# Patient Record
Sex: Female | Born: 1985 | Race: Black or African American | Hispanic: No | Marital: Single
Health system: Midwestern US, Community
[De-identification: ages and names within clinical notes are randomized; demographics above are authoritative.]

## PROBLEM LIST (undated history)

## (undated) MED ORDER — PHENAZOPYRIDINE 100 MG TAB
100 mg | ORAL_TABLET | Freq: Three times a day (TID) | ORAL | Status: AC
Start: ? — End: 2013-03-31

## (undated) MED ORDER — FLUCONAZOLE 150 MG TAB
150 mg | ORAL_TABLET | Freq: Every day | ORAL | Status: AC
Start: ? — End: 2013-07-25

## (undated) MED ORDER — OXYCODONE 5 MG TAB
5 mg | ORAL_TABLET | ORAL | Status: DC | PRN
Start: ? — End: 2013-11-13

## (undated) MED ORDER — CEPHALEXIN 500 MG CAP
500 mg | ORAL_CAPSULE | Freq: Two times a day (BID) | ORAL | Status: AC
Start: ? — End: 2013-03-23

## (undated) MED ORDER — VALACYCLOVIR 500 MG TAB: 500 mg | ORAL_TABLET | Freq: Two times a day (BID) | ORAL | Status: AC

## (undated) MED ORDER — IBUPROFEN 800 MG TAB
800 mg | ORAL_TABLET | Freq: Four times a day (QID) | ORAL | Status: DC | PRN
Start: ? — End: 2013-11-13

## (undated) MED ORDER — IRON 110 MG-FOLATE6 1 MG-C 175 MG-B12 12 MCG-BIOTIN-COPPER-DSS CAPSULE
110 mg-1 mg -175 mg-12 mcg | ORAL_TABLET | Freq: Every day | ORAL | Status: DC
Start: ? — End: 2013-11-13

## (undated) MED ORDER — PHENAZOPYRIDINE 100 MG TAB
100 mg | ORAL_TABLET | Freq: Three times a day (TID) | ORAL | Status: AC
Start: ? — End: 2013-03-19

## (undated) MED ORDER — ONDANSETRON 8 MG TAB, RAPID DISSOLVE
8 mg | ORAL_TABLET | Freq: Three times a day (TID) | ORAL | Status: AC | PRN
Start: ? — End: 2013-10-08

## (undated) MED ORDER — NITROFURANTOIN (25% MACROCRYSTAL FORM) 100 MG CAP
100 mg | ORAL_CAPSULE | Freq: Two times a day (BID) | ORAL | Status: DC
Start: ? — End: 2013-03-27

---
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
## 2008-01-27 IMAGING — CT CT ORBIT/TEMPORAL/IAC W/ CM
3 series · 16 of 47 positions shown, 19 images · IV contrast ([ID] OMNI 300)
Comparison: none

CLINICAL DATA: Swelling around left ear which has gotten worse and is painful.  
CT OF THE ORBITS WITH CONTRAST:
TECHNIQUE: Axial and coronal plane CT imaging was performed through the orbits after administration of intravenous contrast.
Contrast:  100 cc Omnipaque 300.

[Series 2: supine · axial · 0.38mm/px · z∈[+48,+130]mm · 10 of 39 slices shown, 13 images]
[im 3/39  brain]
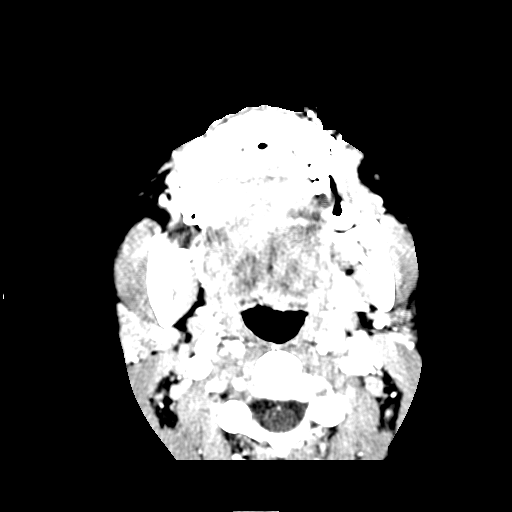
[im 3/39  bone]
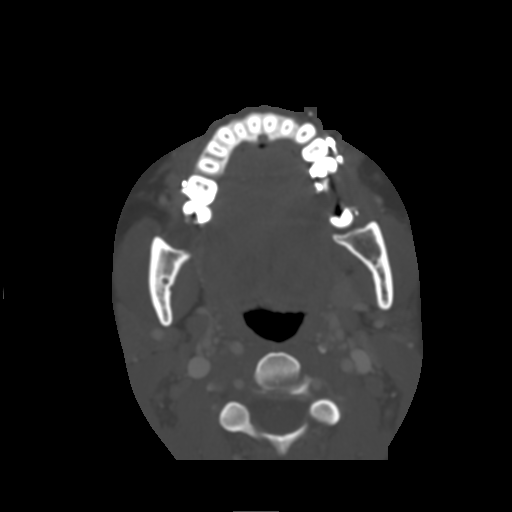
[im 7/39  bone]
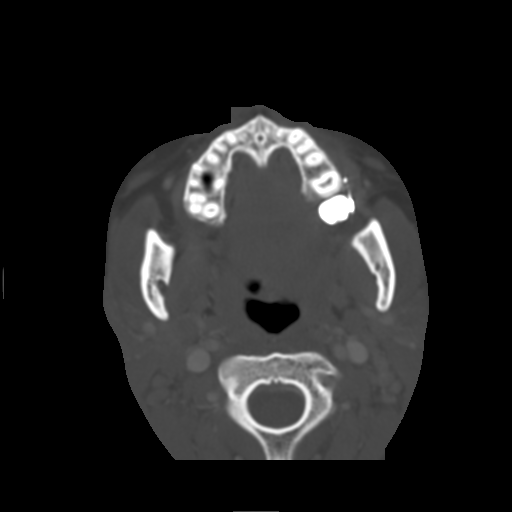
[im 11/39  bone]
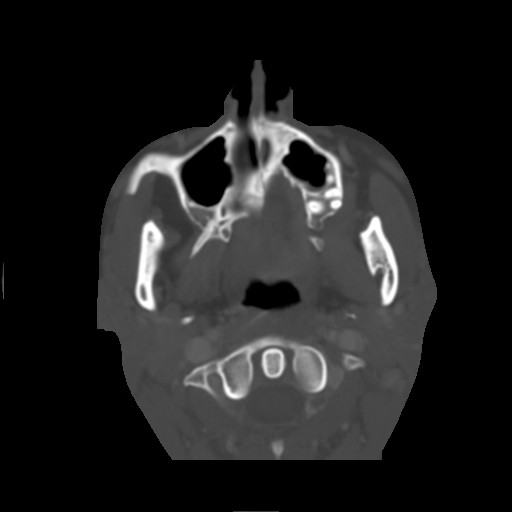
[im 14/39  bone]
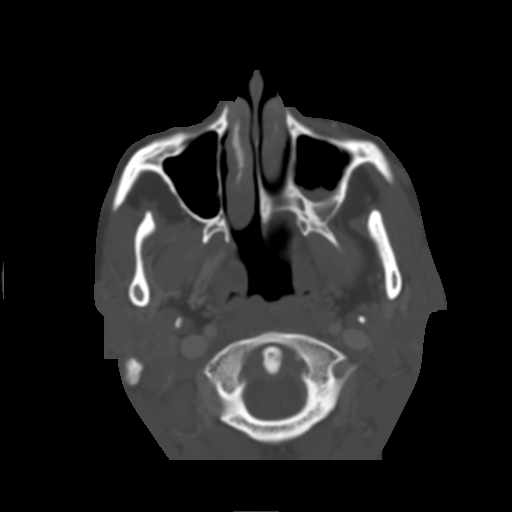
[im 18/39  brain]
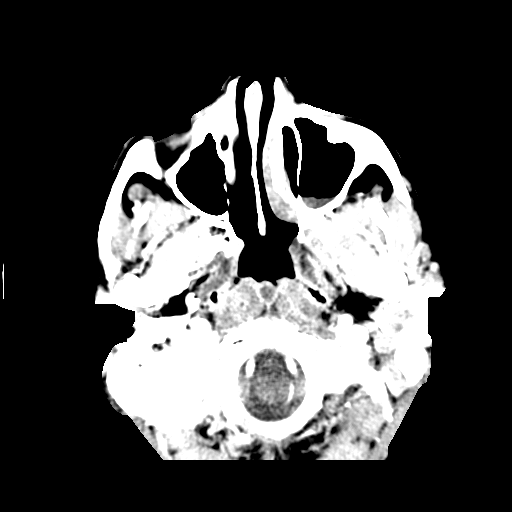
[im 18/39  bone]
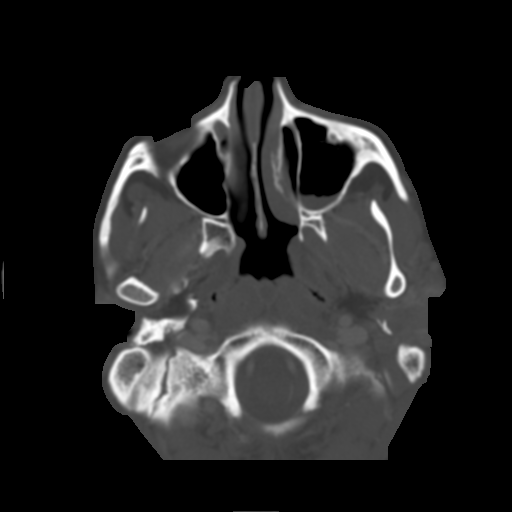
[im 21/39  bone]
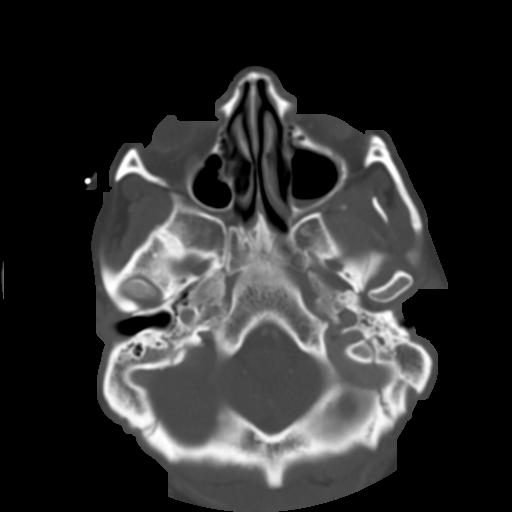
[im 25/39  bone]
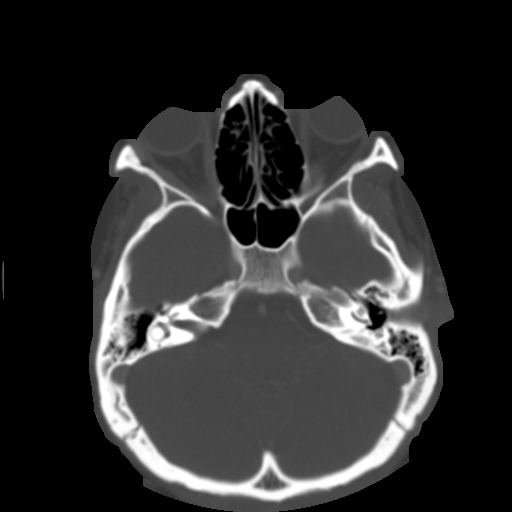
[im 29/39  bone]
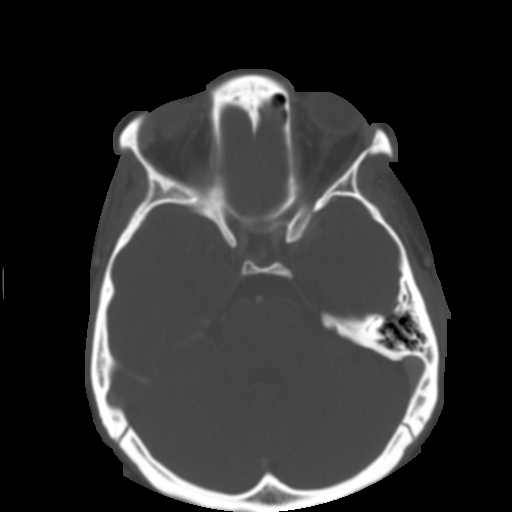
[im 32/39  brain]
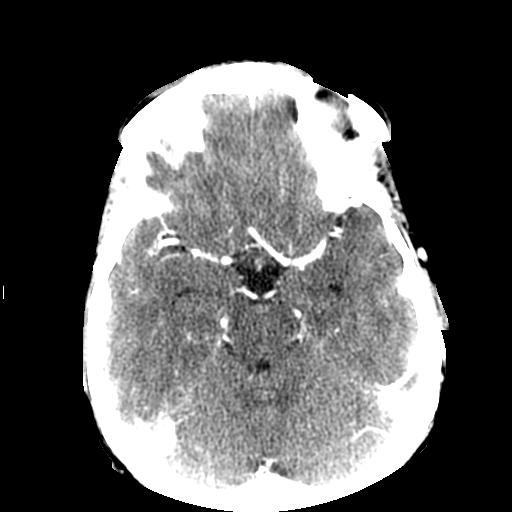
[im 32/39  bone]
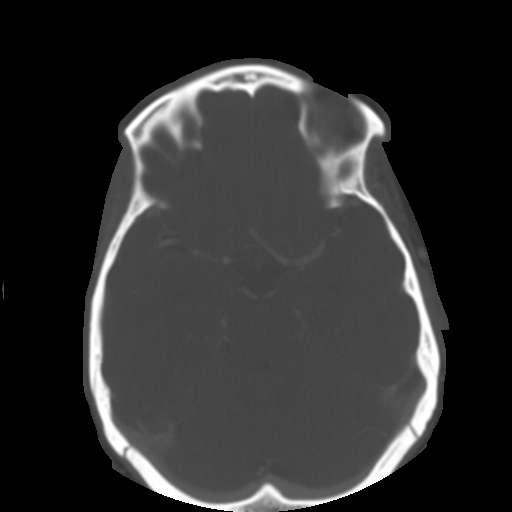
[im 36/39  bone]
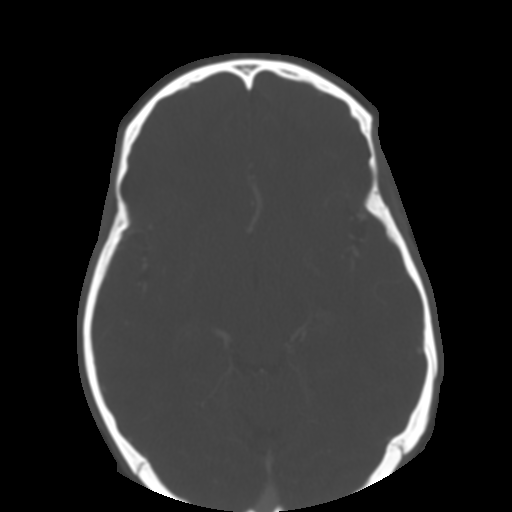

[Series 400: coronals · coronal · 0.37mm/px · 3 of 53 slices shown]
[im 18/53  bone]
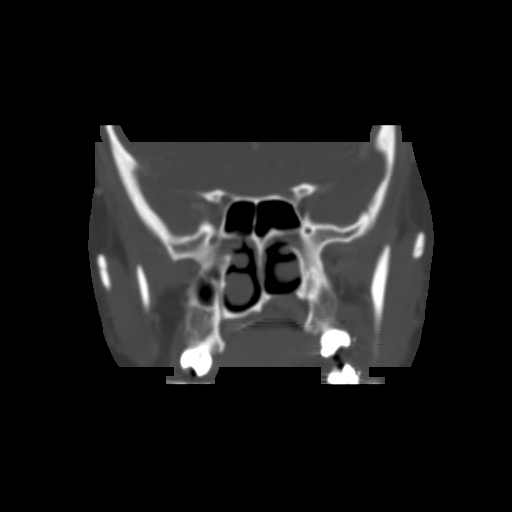
[im 24/53  bone]
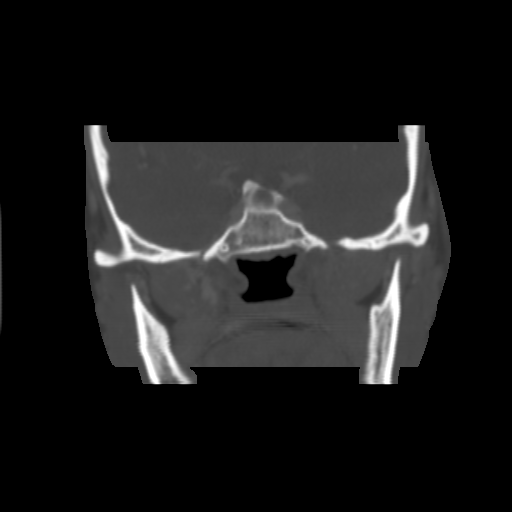
[im 29/53  bone]
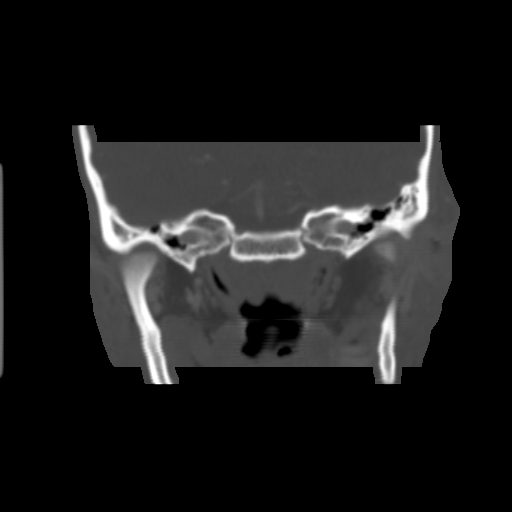

[Series 401: sagittals · sagittal · 0.37mm/px · 3 of 53 slices shown]
[im 18/53  bone]
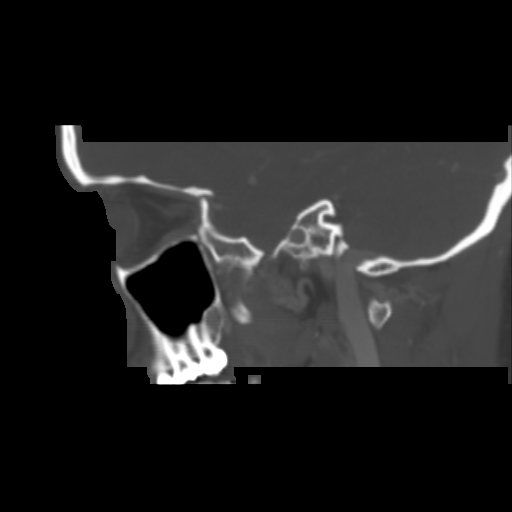
[im 27/53  bone]
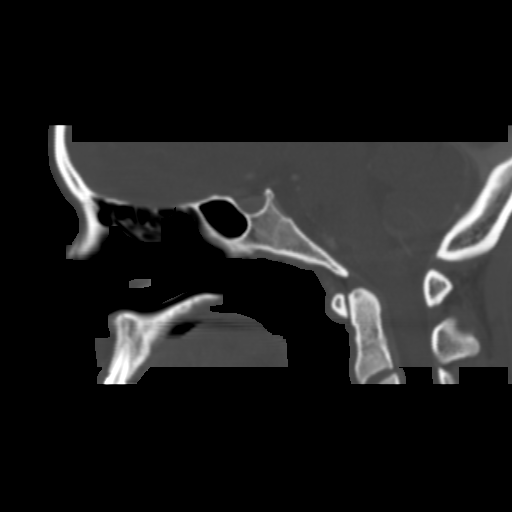
[im 35/53  bone]
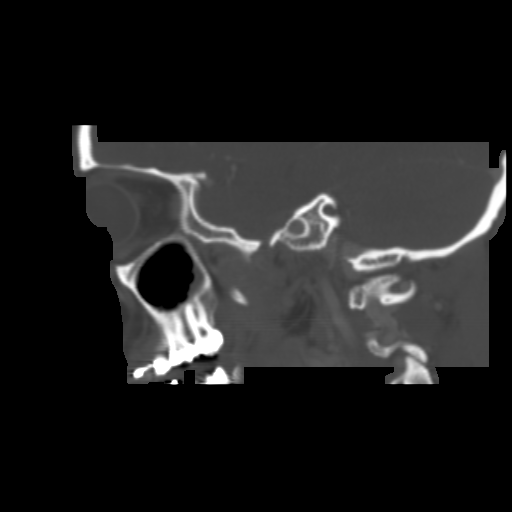

[16 of 47 positions shown; findings below may reference images not displayed]

FINDINGS: There is soft tissue swelling around the left external auditory canal and anterior aspects of the left ear.  Much of this soft tissue swelling appears to be due to cellulitis.  However there is a small focus of low attenuation within the superficial soft tissues of the inferior left ear which could represent a small abscess.  There does appear to be some enhancement of the skin of the external auditory canal and external otitis is a consideration.  The inner ear appears normal and the ossicles appear intact.  Internal auditory canals also appear normal.  There is fluid in the left maxillary sinus consistent with a left maxillary sinusitis.  No bony erosion is seen.
IMPRESSION: 1.  Cellulitis around the external left ear with some thickening of the skin of the external auditory canal which may indicate left external otitis with possible small superficial abscess.  
2.  Left maxillary sinusitis.

## 2013-03-16 LAB — HEMATOCRIT: Hct, External: 36.3

## 2013-03-16 LAB — URINALYSIS W/O MICRO

## 2013-03-16 LAB — BLOOD TYPE, (ABO+RH)
ABO,Rh: A POS
TYPE, ABO & RH, EXTERNAL: A POS

## 2013-03-16 LAB — TSH 3RD GENERATION: TSH, External: 1.05

## 2013-03-16 LAB — PLATELET COUNT: Platelet cnt., External: 381

## 2013-03-16 LAB — HEMOGLOBIN: Hgb, External: 11.6

## 2013-03-16 LAB — HIV-1 AB WESTERN BLOT CONFIRM ONLY

## 2013-03-16 LAB — AMB POC URINE PREGNANCY TEST, VISUAL COLOR COMPARISON: HCG urine, Ql. (POC): POSITIVE

## 2013-03-16 LAB — RPR

## 2013-03-16 LAB — HEPATITIS C AB: Hepatitis C Ab, External: NEGATIVE

## 2013-03-16 LAB — HEPATITIS B SURFACE AG W/REFLEX: HBsAg, External: NEGATIVE

## 2013-03-16 LAB — ANTIBODY SCREEN: Antibody screen, External: NEGATIVE

## 2013-03-16 LAB — HEPATITIS C ANTIBODY: HEPATITIS C AB,   EXT: NEGATIVE

## 2013-03-16 NOTE — Progress Notes (Signed)
HISTORY OF PRESENT ILLNESS  Ashley Pearson is a 27 y.o. female.  HPI  Patient presents today with c/o headaches, fatigue, vaginal discharge, breast pain and constipation. Patient missed her last period. Patient also c/o urinary pain and has right sided flank pain. This has been ongoing for this week and has gradually worsened. Nothing has improved it. She is not experiencing any bleeding, nausea, or fever.     Age at 1st period: 81  Menstrual cycle occurs every 26 days and lasts for 6 days.  LMP: 02/09/13    Chief Complaint   Patient presents with   ??? Missed Menses       No Known Allergies  Current Outpatient Prescriptions   Medication Sig Dispense Refill   ??? PNV no.24-iron-folic acid-dha (PRENATAL DHA+COMPLETE PRENATAL) [id number] mg-mcg-mg cmpk Take  by mouth.       ??? valACYclovir (VALTREX) 500 mg tablet Take 1 Tab by mouth two (2) times a day. See handout by provider for specific dosing  30 Tab  5     Past Medical History   Diagnosis Date   ??? Headache    ??? UTI (urinary tract infection)    ??? Abnormal Pap smear      colposcopy, then normal   ??? Constipation, chronic    ??? 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 Hx tonsil and adenoidectomy  1997   ??? Hx wisdom teeth extraction  2007   ??? Hx colonoscopy       high school     History     Social History   ??? Marital Status: SINGLE     Spouse Name: N/A     Number of Children: N/A   ??? Years of Education: N/A     Occupational History   ??? Not on file.     Social History Main Topics   ??? Smoking status: Never Smoker    ??? Smokeless tobacco: Never Used   ??? Alcohol Use: No   ??? Drug Use: No   ??? Sexually Active: Yes -- Female partner(s)     Birth Control/ Protection: Condom     Other Topics Concern   ??? Not on file     Social History Narrative   ??? No narrative on file     Family History   Problem Relation Age of Onset    ??? Breast Cancer Mother    ??? Heart Disease Mother    ??? Migraines Mother    ??? Hypertension Mother    ??? Diabetes Father    ??? Asthma Brother    ??? Ovarian Cancer Paternal Grandmother      BP 110/65   Pulse 95   Ht 5\' 6"  (1.676 m)   Wt 158 lb (71.668 kg)   BMI 25.51 kg/m2   SpO2 100%   LMP 01/31/2013           Review of Systems   Constitutional: Positive for malaise/fatigue.   Eyes: Negative.    Respiratory: Negative.    Cardiovascular: Negative.    Gastrointestinal: Positive for constipation.   Genitourinary: Positive for flank pain.        Vaginal discharge  Breast tenderness  Urinary pain   Musculoskeletal: Negative.    Skin: Negative.  Neurological: Positive for headaches.   Endo/Heme/Allergies: Negative.    Psychiatric/Behavioral: Negative.        Physical Exam   Nursing note and vitals reviewed.  Constitutional: She is oriented to person, place, and time. She appears well-developed and well-nourished. She is active and cooperative.  Non-toxic appearance. She does not have a sickly appearance. She does not appear ill. No distress.   HENT:   Head: Normocephalic and atraumatic.   Eyes: Right eye exhibits no discharge. Left eye exhibits no discharge. No scleral icterus.   Neck: Normal range of motion.   Cardiovascular: Normal rate.    Pulmonary/Chest: Effort normal. No respiratory distress.   Abdominal: Soft. Normal appearance. There is tenderness in the right lower quadrant and suprapubic area. There is CVA tenderness.   Musculoskeletal: Normal range of motion.   Neurological: She is alert and oriented to person, place, and time. Coordination and gait normal.   Skin: Skin is warm and dry. She is not diaphoretic.   Psychiatric: She has a normal mood and affect. Her speech is normal and behavior is normal. Judgment and thought content normal. Cognition and memory are normal.       ASSESSMENT and PLAN  Pregnancy confirmed with positive urine pregnancy test in office today after missed period. ROS reveals common  discomforts of pregnancy and UTI symptoms. Will treat and await urine culture results. Reviewed anticipated EDD with patient based on LMP and/or conception. Questions encouraged and answered. Reviewed plan of care for supervision of pregnancy. Patient will return for a new OB education visit after completing prenatal lab panel and/or additional labs based on medical history. Will review results of labs at [facility] education visit in 1-2 weeks. Encouraged taking a daily prenatal vitamin with folic acid and DHA. Samples provided. Handout given for OTC medications that we authorize in pregnancy. Patient instructed to notify us or seek medical attention for any abdominal pain, cramping and vaginal bleeding, fever, or vomiting other than mild pregnancy sickness.   Greater than 50% of 30 minutes spent on face to face counseling, education, and examining the patient regarding missed period, pregnancy dx and POC for follow up visits.      Encounter Diagnoses   Name Primary?   ??? Missed menses Yes   ??? Urinary pain      Orders Placed This Encounter   ??? CULTURE, URINE   ??? CBC WITH AUTOMATED DIFF   ??? HIV 1/2 AB, BY IMMUNOBLOT   ??? HEP B SURFACE AG   ??? HEPATITIS C AB   ??? RPR   ??? RUBELLA AB, IGG   ??? TSH, 3RD GENERATION   ??? HSV 1/2 AB, IGG/IGM   ??? AMB POC URINE PREGNANCY TEST, VISUAL COLOR COMPARISON   ??? TYPE, ABO & RH   ??? ANTIBODY SCREEN   ??? DISCONTD: diclofenac potassium (CATAFLAM) 50 mg tablet   ??? phenazopyridine (PYRIDIUM) 100 mg tablet   ??? cephALEXin (KEFLEX) 500 mg capsule     Follow-up Disposition:  Return in about 2 weeks (around 03/30/2013) for new ob education.

## 2013-03-16 NOTE — Patient Instructions (Addendum)
MyChart Activation    Thank you for requesting access to MyChart. Please follow the instructions below to securely access and download your online medical record. MyChart allows you to send messages to your doctor, view your test results, renew your prescriptions, schedule appointments, and more.    How Do I Sign Up?    1. In your internet browser, go to [email or web address]  2. Click on the First Time User? Click Here link in the Sign In box. You will be redirect to the New Member Sign Up page.  3. Enter your MyChart Access Code exactly as it appears below. You will not need to use this code after you???ve completed the sign-up process. If you do not sign up before the expiration date, you must request a new code.    MyChart Access Code: [email or web address]  Expires: 06/14/2013  2:23 PM (This is the date your MyChart access code will expire)    4. Enter the last four digits of your Social Security Number (xxxx) and Date of Birth (mm/dd/yyyy) as indicated and click Submit. You will be taken to the next sign-up page.  5. Create a MyChart ID. This will be your MyChart login ID and cannot be changed, so think of one that is secure and easy to remember.  6. Create a MyChart password. You can change your password at any time.  7. Enter your Password Reset Question and Answer. This can be used at a later time if you forget your password.   8. Enter your e-mail address. You will receive e-mail notification when new information is available in MyChart.  9. Click Sign Up. You can now view and download portions of your medical record.  10. Click the Download Summary menu link to download a portable copy of your medical information.    Additional Information    If you have questions, please visit the Frequently Asked Questions section of the MyChart website at [email or web address] Remember, MyChart is NOT to be used for urgent needs. For medical emergencies, dial 911.      Weeks 6 to 10 of Your Pregnancy:  After Your Visit  Your Care Instructions  Congratulations on your pregnancy. This is an exciting and important time for you.  During the first 6 to 10 weeks of your pregnancy, your body goes through many changes. Your baby grows very fast, even though you cannot feel it yet. You may start to notice that you feel different, both in your body and your emotions. Because each woman's pregnancy is unique, there is no right way to feel. You may feel the healthiest you have ever been, or you may feel tired or sick to your stomach ("morning sickness").  These early weeks are a time to make healthy choices and to eat the best foods for you and your baby. This care sheet will give you some ideas.  This is also a good time to think about birth defects testing. These are tests done during pregnancy to look for possible problems with the baby. First trimester tests for birth defects can be done between 10 and 13 weeks of pregnancy, depending on the test. Talk with your doctor about what kinds of tests are available.  Follow-up care is a key part of your treatment and safety. Be sure to make and go to all appointments, and call your doctor if you are having problems. It's also a good idea to know your test results and keep a list of the medicines you take.  How can  you care for yourself at home?  Eat well  ?? Eat at least 3 meals and 2 healthy snacks every day. Eat fresh, whole foods, including:  ?? 7 or more servings of bread, tortillas, cereal, rice, pasta, or oatmeal.  ?? 3 or more servings of vegetables, especially leafy green vegetables.  ?? 2 or more servings of fruits.  ?? 3 or more servings of milk, yogurt, or cheese.  ?? 2 or more servings of meat, [place], chicken, fish, eggs, or dried beans.  ?? Drink plenty of fluids, especially water. Avoid sodas and other sweetened drinks.  ?? Choose foods that have important vitamins for your baby, such as calcium, iron, and folate.  ?? Dairy products, tofu, canned fish with bones, almonds,  broccoli, dark leafy greens, corn tortillas, and fortified orange juice are good sources of calcium.  ?? Beef, poultry, liver, spinach, lentils, dried beans, fortified cereals, and dried fruits are rich in iron.  ?? Dark leafy greens, broccoli, asparagus, liver, fortified cereals, orange juice, peanuts, and almonds are good sources of folate.  ?? Avoid foods that could harm your baby.  ?? Do not eat raw or undercooked meat, chicken, or fish (such as sushi or raw oysters).  ?? Do not eat raw eggs or foods that contain raw eggs, such as Caesar dressing.  ?? Do not eat soft cheeses and unpasteurized dairy foods, such as Brie, feta, or blue cheese.  ?? Do not eat fish that contains a lot of mercury, such as shark, swordfish, tilefish, or king mackerel. Do not eat more than one small can of tuna each week.  ?? Do not eat raw sprouts, especially alfalfa sprouts.  ?? Cut down on caffeine, such as coffee, tea, and cola.  Protect yourself and your baby  ?? Do not touch kitty litter or cat feces. They can cause an infection that could harm your baby.  ?? High body temperature can be harmful to your baby. So if you want to use a sauna or hot tub, be sure to talk to your doctor about how to use it safely.  Cope with morning sickness  ?? Sip small amounts of water, juices, or shakes. Try drinking between meals, not with meals.  ?? Eat 5 or 6 small meals a day. Try dry toast or crackers when you first get up, and eat breakfast a little later.  ?? Avoid spicy, greasy, and fatty foods.  ?? When you feel sick, open your windows or go for a short walk to get fresh air.  ?? Try nausea wristbands. These help some women.  ?? Tell your doctor if you think your prenatal vitamins make you sick.   Where can you learn more?   Go to [email or web address]  Enter G112 in the search box to learn more about "Weeks 6 to 10 of Your Pregnancy: After Your Visit."   ?? 2006-2014 [employer]. Care instructions adapted under license by [employer] (which disclaims liability or warranty for this information). This care instruction is for use with your licensed healthcare professional. If you have questions about a medical condition or this instruction, always ask your healthcare professional. Healthwise, Incorporated disclaims any warranty or liability for your use of this information.  Content Version: 10.0.270728; Last Revised: March 26, 2011

## 2013-03-25 NOTE — Progress Notes (Signed)
Notified via MyChart of bacteria on urine culture

## 2013-03-29 NOTE — Patient Instructions (Addendum)
MyChart Activation    Thank you for requesting access to MyChart. Please follow the instructions below to securely access and download your online medical record. MyChart allows you to send messages to your doctor, view your test results, renew your prescriptions, schedule appointments, and more.    How Do I Sign Up?    1. In your internet browser, go to [email or web address]  2. Click on the First Time User? Click Here link in the Sign In box. You will be redirect to the New Member Sign Up page.  3. Enter your MyChart Access Code exactly as it appears below. You will not need to use this code after you???ve completed the sign-up process. If you do not sign up before the expiration date, you must request a new code.    MyChart Access Code: Not generated  Current MyChart Status: Active (This is the date your MyChart access code will expire)    4. Enter the last four digits of your Social Security Number (xxxx) and Date of Birth (mm/dd/yyyy) as indicated and click Submit. You will be taken to the next sign-up page.  5. Create a MyChart ID. This will be your MyChart login ID and cannot be changed, so think of one that is secure and easy to remember.  6. Create a MyChart password. You can change your password at any time.  7. Enter your Password Reset Question and Answer. This can be used at a later time if you forget your password.   8. Enter your e-mail address. You will receive e-mail notification when new information is available in MyChart.  9. Click Sign Up. You can now view and download portions of your medical record.  10. Click the Download Summary menu link to download a portable copy of your medical information.    Additional Information    If you have questions, please visit the Frequently Asked Questions section of the MyChart website at [email or web address] Remember, MyChart is NOT to be used for urgent needs. For medical emergencies, dial 911.      Weeks 6 to 10 of Your Pregnancy:  After Your Visit  Your Care Instructions     Congratulations on your pregnancy. This is an exciting and important time for you.  During the first 6 to 10 weeks of your pregnancy, your body goes through many changes. Your baby grows very fast, even though you cannot feel it yet. You may start to notice that you feel different, both in your body and your emotions. Because each woman's pregnancy is unique, there is no right way to feel. You may feel the healthiest you have ever been, or you may feel tired or sick to your stomach ("morning sickness").  These early weeks are a time to make healthy choices and to eat the best foods for you and your baby. This care sheet will give you some ideas.  This is also a good time to think about birth defects testing. These are tests done during pregnancy to look for possible problems with the baby. First trimester tests for birth defects can be done between 10 and 13 weeks of pregnancy, depending on the test. Talk with your doctor about what kinds of tests are available.  Follow-up care is a key part of your treatment and safety. Be sure to make and go to all appointments, and call your doctor if you are having problems. It's also a good idea to know your test results and keep a list of the medicines you take.  How can you care for yourself at home?  Eat well  ?? Eat at least 3 meals and 2 healthy snacks every day. Eat fresh, whole foods, including:  ?? 7 or more servings of bread, tortillas, cereal, rice, pasta, or oatmeal.  ?? 3 or more servings of vegetables, especially leafy green vegetables.  ?? 2 or more servings of fruits.  ?? 3 or more servings of milk, yogurt, or cheese.  ?? 2 or more servings of meat, [place], chicken, fish, eggs, or dried beans.  ?? Drink plenty of fluids, especially water. Avoid sodas and other sweetened drinks.  ?? Choose foods that have important vitamins for your baby, such as calcium, iron, and folate.  ?? Dairy products, tofu, canned fish with bones,  almonds, broccoli, dark leafy greens, corn tortillas, and fortified orange juice are good sources of calcium.  ?? Beef, poultry, liver, spinach, lentils, dried beans, fortified cereals, and dried fruits are rich in iron.  ?? Dark leafy greens, broccoli, asparagus, liver, fortified cereals, orange juice, peanuts, and almonds are good sources of folate.  ?? Avoid foods that could harm your baby.  ?? Do not eat raw or undercooked meat, chicken, or fish (such as sushi or raw oysters).  ?? Do not eat raw eggs or foods that contain raw eggs, such as Caesar dressing.  ?? Do not eat soft cheeses and unpasteurized dairy foods, such as Brie, feta, or blue cheese.  ?? Do not eat fish that contains a lot of mercury, such as shark, swordfish, tilefish, or king mackerel. Do not eat more than one small can of tuna each week.  ?? Do not eat raw sprouts, especially alfalfa sprouts.  ?? Cut down on caffeine, such as coffee, tea, and cola.  Protect yourself and your baby  ?? Do not touch kitty litter or cat feces. They can cause an infection that could harm your baby.  ?? High body temperature can be harmful to your baby. So if you want to use a sauna or hot tub, be sure to talk to your doctor about how to use it safely.  Cope with morning sickness  ?? Sip small amounts of water, juices, or shakes. Try drinking between meals, not with meals.  ?? Eat 5 or 6 small meals a day. Try dry toast or crackers when you first get up, and eat breakfast a little later.  ?? Avoid spicy, greasy, and fatty foods.  ?? When you feel sick, open your windows or go for a short walk to get fresh air.  ?? Try nausea wristbands. These help some women.  ?? Tell your doctor if you think your prenatal vitamins make you sick.   Where can you learn more?   Go to [email or web address]  Enter G112 in the search box to learn more about "Weeks 6 to 10 of Your Pregnancy: After Your Visit."   ?? 2006-2014 Healthwise, Incorporated. Care instructions adapted under license  by [employer] (which disclaims liability or warranty for this information). This care instruction is for use with your licensed healthcare professional. If you have questions about a medical condition or this instruction, always ask your healthcare professional. Healthwise, Incorporated disclaims any warranty or liability for your use of this information.  Content Version: 10.1.311062; Current as of: March 26, 2011

## 2013-03-29 NOTE — Progress Notes (Signed)
HISTORY OF PRESENT ILLNESS  Ashley Pearson is a 27 y.o. female.  HPI  Patient presents today with c/o sharp, pelvic cramping for the past two weeks. It is intermittent throughout the day but occurs daily. Pyridium (for UTI) in early pregnancy helped cramping but it returned when she stopped taking the med.  Patient reports no bleeding. Patient is currently [redacted]w[redacted]d by LMP of 01/31/13 with an EDD of 11/07/12.      No Known Allergies  Current Outpatient Prescriptions   Medication Sig Dispense Refill   ??? PNV no.24-iron-folic acid-dha (PRENATAL DHA+COMPLETE PRENATAL) 30-975-300 mg-mcg-mg cmpk Take  by mouth.       ??? valACYclovir (VALTREX) 500 mg tablet Take 1 Tab by mouth two (2) times a day. See handout by provider for specific dosing  30 Tab  5     Past Medical History   Diagnosis Date   ??? Headache    ??? UTI (urinary tract infection)    ??? Abnormal Pap smear      colposcopy, then normal   ??? Constipation, chronic    ??? 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 Hx tonsil and adenoidectomy  1997   ??? Hx wisdom teeth extraction  2007   ??? Hx colonoscopy       high school     History     Social History   ??? Marital Status: SINGLE     Spouse Name: N/A     Number of Children: N/A   ??? Years of Education: N/A     Occupational History   ??? Not on file.     Social History Main Topics   ??? Smoking status: Never Smoker    ??? Smokeless tobacco: Never Used   ??? Alcohol Use: No   ??? Drug Use: No   ??? Sexually Active: Yes -- Female partner(s)     Birth Control/ Protection: Condom     Other Topics Concern   ??? Not on file     Social History Narrative   ??? No narrative on file     Family History   Problem Relation Age of Onset   ??? Breast Cancer Mother    ??? Heart Disease Mother    ??? Migraines Mother    ??? Hypertension Mother    ??? Diabetes Father    ??? Asthma Brother    ??? Ovarian Cancer  Paternal Grandmother      BP 115/72   Pulse 81   Ht 5\' 6"  (1.676 m)   Wt 163 lb 5 oz (74.078 kg)   BMI 26.37 kg/m2   SpO2 98%   LMP 01/31/2013           Review of Systems   Constitutional: Negative.    HENT: Negative.    Eyes: Negative.    Respiratory: Negative.    Cardiovascular: Negative.    Gastrointestinal: Negative.    Genitourinary:        Pelvic cramping   Musculoskeletal: Negative.    Skin: Negative.    Neurological: Negative.    Endo/Heme/Allergies: Negative.    Psychiatric/Behavioral: Negative.        Physical Exam   Nursing note and vitals reviewed.  Constitutional: She is oriented to person, place, and time. She appears  well-developed and well-nourished. She is active and cooperative.  Non-toxic appearance. She does not have a sickly appearance. She does not appear ill. No distress.   HENT:   Head: Normocephalic and atraumatic.   Eyes: Right eye exhibits no discharge. Left eye exhibits no discharge. No scleral icterus.   Neck: Normal range of motion.   Cardiovascular: Normal rate.    Pulmonary/Chest: Effort normal. No respiratory distress.   Abdominal: Soft. She exhibits no distension.   Genitourinary:   Gravid uterus, multiple gestation seen on [place]   Musculoskeletal: Normal range of motion.   Neurological: She is alert and oriented to person, place, and time. Coordination and gait normal.   Skin: Skin is warm and dry. She is not diaphoretic.   Psychiatric: She has a normal mood and affect. Her speech is normal and behavior is normal. Judgment and thought content normal. Cognition and memory are normal.       ASSESSMENT and PLAN  Twin pregnancy-diamniotic, unable to determine if dichorionic  Small subchorionic bleed   Small uterine subserosal fibroid  Encounter Diagnoses   Name Primary?   ??? Threatened miscarriage in early pregnancy Yes   ??? Multiple gestation in first trimester    ??? Uterine fibroid in pregnancy, first trimester      No orders of the defined types were placed in this encounter.      Follow-up Disposition:  Return in about 3 weeks (around 04/23/2013), or if symptoms worsen or fail to improve, for 12w [place] and visit.

## 2013-04-02 NOTE — Progress Notes (Signed)
New Ob education visit completed. Routine prenatal labs reviewed. Prenatal vitamins if needed. Complete personal, genetic, and familial history recorded and gestational age specific education provided. See prenatal checklist. Questions encouraged and answered. Follow up appointment scheduled for 12w ultrasound and visit. Ashley Pearson's mom was present for her visit today. The FOB is unaware at this time of the pregnancy. She will notify him prior to the next visit.

## 2013-04-02 NOTE — Patient Instructions (Signed)
Learning About When to Call Your Doctor During Pregnancy (Up to 20 Weeks)  Your Care Instructions  It's common to have concerns about what might be a problem during pregnancy. Although most pregnant women don't have any serious problems, it's important to know when to call your doctor if you have certain symptoms.  These are general suggestions. Your doctor may give you some more information about when to call.  When to call your doctor (up to 20 weeks)  Call 911 anytime you think you may need emergency care. For example, call if:  ?? You passed out (lost consciousness).  Call your doctor now or seek immediate medical care if:  ?? You have a fever.  ?? You have vaginal bleeding.  ?? You are dizzy or lightheaded, or you feel like you may faint.  ?? You have symptoms of a urinary tract infection. These may include:  ?? Pain or burning when you urinate.  ?? A frequent need to urinate without being able to pass much urine.  ?? Pain in the flank, which is just below the rib cage and above the waist on either side of the back.  ?? Blood in your urine.  ?? You have belly pain.  ?? You think you are having contractions.  ?? You have a sudden release of fluid from your vagina.  Watch closely for changes in your health, and be sure to contact your doctor if:  ?? You have vaginal discharge that smells bad.  ?? You have other concerns about your pregnancy.  Follow-up care is a key part of your treatment and safety. Be sure to make and go to all appointments, and call your doctor if you are having problems. It's also a good idea to know your test results and keep a list of the medicines you take.   Where can you learn more?   Go to http://www.healthwise.net/[facility]  Enter G674 in the search box to learn more about "Learning About When to Call Your Doctor During Pregnancy (Up to 20 Weeks)."   ?? 2006-2014 Health[employer], [employer]. Care instructions adapted under license by [place] (which disclaims liability or warranty for this  information). This care instruction is for use with your licensed healthcare professional. If you have questions about a medical condition or this instruction, always ask your healthcare professional. Healthwise, Incorporated disclaims any warranty or liability for your use of this information.  Content Version: 10.1.311062; Current as of: July 07, 2011

## 2013-04-02 NOTE — Patient Instructions (Signed)
Weeks 6 to 10 of Your Pregnancy: After Your Visit  Your Care Instructions     Congratulations on your pregnancy. This is an exciting and important time for you.  During the first 6 to 10 weeks of your pregnancy, your body goes through many changes. Your baby grows very fast, even though you cannot feel it yet. You may start to notice that you feel different, both in your body and your emotions. Because each woman's pregnancy is unique, there is no right way to feel. You may feel the healthiest you have ever been, or you may feel tired or sick to your stomach ("morning sickness").  These early weeks are a time to make healthy choices and to eat the best foods for you and your baby. This care sheet will give you some ideas.  This is also a good time to think about birth defects testing. These are tests done during pregnancy to look for possible problems with the baby. First trimester tests for birth defects can be done between 10 and 13 weeks of pregnancy, depending on the test. Talk with your doctor about what kinds of tests are available.  Follow-up care is a key part of your treatment and safety. Be sure to make and go to all appointments, and call your doctor if you are having problems. It's also a good idea to know your test results and keep a list of the medicines you take.  How can you care for yourself at home?  Eat well  ?? Eat at least 3 meals and 2 healthy snacks every day. Eat fresh, whole foods, including:  ?? 7 or more servings of bread, tortillas, cereal, rice, pasta, or oatmeal.  ?? 3 or more servings of vegetables, especially leafy green vegetables.  ?? 2 or more servings of fruits.  ?? 3 or more servings of milk, yogurt, or cheese.  ?? 2 or more servings of meat, turkey, chicken, fish, eggs, or dried beans.  ?? Drink plenty of fluids, especially water. Avoid sodas and other sweetened drinks.  ?? Choose foods that have important vitamins for your baby, such as calcium, iron, and folate.  ?? Dairy products,  tofu, canned fish with bones, almonds, broccoli, dark leafy greens, corn tortillas, and fortified orange juice are good sources of calcium.  ?? Beef, poultry, liver, spinach, lentils, dried beans, fortified cereals, and dried fruits are rich in iron.  ?? Dark leafy greens, broccoli, asparagus, liver, fortified cereals, orange juice, peanuts, and almonds are good sources of folate.  ?? Avoid foods that could harm your baby.  ?? Do not eat raw or undercooked meat, chicken, or fish (such as sushi or raw oysters).  ?? Do not eat raw eggs or foods that contain raw eggs, such as Caesar dressing.  ?? Do not eat soft cheeses and unpasteurized dairy foods, such as Brie, feta, or blue cheese.  ?? Do not eat fish that contains a lot of mercury, such as shark, swordfish, tilefish, or king mackerel. Do not eat more than one small can of tuna each week.  ?? Do not eat raw sprouts, especially alfalfa sprouts.  ?? Cut down on caffeine, such as coffee, tea, and cola.  Protect yourself and your baby  ?? Do not touch kitty litter or cat feces. They can cause an infection that could harm your baby.  ?? High body temperature can be harmful to your baby. So if you want to use a sauna or hot tub, be sure to talk   to your doctor about how to use it safely.  Cope with morning sickness  ?? Sip small amounts of water, juices, or shakes. Try drinking between meals, not with meals.  ?? Eat 5 or 6 small meals a day. Try dry toast or crackers when you first get up, and eat breakfast a little later.  ?? Avoid spicy, greasy, and fatty foods.  ?? When you feel sick, open your windows or go for a short walk to get fresh air.  ?? Try nausea wristbands. These help some women.  ?? Tell your doctor if you think your prenatal vitamins make you sick.   Where can you learn more?   Go to http://www.healthwise.net/[facility]  Enter G112 in the search box to learn more about "Weeks 6 to 10 of Your Pregnancy: After Your Visit."   ?? 2006-2014 Healthwise, Inc[employer]. Care  instructions adapted under license by [place] (which disclaims liability or warranty for this information). This care instruction is for use with your licensed healthcare professional. If you have questions about a medical condition or this instruction, always ask your healthcare professional. Healthwise, Incorporated disclaims any warranty or liability for your use of this information.  Content Version: 10.1.311062; Current as of: March 26, 2011

## 2013-04-24 LAB — AMB POC URINALYSIS DIP STICK MANUAL W/O MICRO
Bilirubin (UA POC): NEGATIVE
Blood (UA POC): NEGATIVE
Glucose (UA POC): NEGATIVE
Ketones (UA POC): NEGATIVE
Leukocyte esterase (UA POC): NEGATIVE
Nitrites (UA POC): NEGATIVE
Protein (UA POC): NEGATIVE mg/dL
Specific gravity (UA POC): 1.005 (ref 1.001–1.035)
Urobilinogen (UA POC): 0.2 (ref 0.2–1)
pH (UA POC): 7 (ref 4.6–8.0)

## 2013-04-24 LAB — AMB POC HEMOGLOBIN (HGB): Hemoglobin (POC): 10.6

## 2013-04-24 LAB — PAP SMEAR
PAP Smear, External: NEGATIVE
Pap smear, External: NEGATIVE

## 2013-04-24 NOTE — Progress Notes (Signed)
HISTORY OF PRESENT ILLNESS  Ashley Pearson is a 27 y.o. female.  HPI  Patient presents today for her 12w OB exam. She has had persistent cramping in the first trimester that is beginning to resolve and is less frequent. She is not cramping today. No bleeding, leaking of fluid, or accompanying complaints.    Patient's last menstrual period was 01/31/2013.  Estimated Date of Delivery: 11/03/13  OB History    Grav Para Term Preterm Abortions TAB SAB Ect Mult Living    1                   Allergies:   No Known Allergies    Medication History:  Current Outpatient Prescriptions   Medication Sig Dispense Refill   ??? iron-FA#6-C-B12-biot-coppr-DSS (FERIVAFA) 110 mg-1 mg -175 mg-12 mcg cap Take 1 Tab by mouth daily.  30 Tab  11   ??? PNV no.24-iron-folic acid-dha (PRENATAL DHA+COMPLETE PRENATAL) 30-975-300 mg-mcg-mg cmpk Take  by mouth.       ??? valACYclovir (VALTREX) 500 mg tablet Take 1 Tab by mouth two (2) times a day. See handout by provider for specific dosing  30 Tab  5   ??? cholecalciferol, vitamin D3, (VITAMIN D3) 2,000 unit tab Take  by mouth.       ??? folic acid 800 mcg tablet Take 800 mcg by mouth daily.           Past Medical History:  Past Medical History   Diagnosis Date   ??? Headache    ??? UTI (urinary tract infection)    ??? Abnormal Pap smear      colposcopy, then normal   ??? Constipation, chronic    ??? 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 Genital herpes simplex type 2      never recalls having an outbreak, IgG positive       Past Surgical History:  Past Surgical History   Procedure Laterality Date   ??? Hx tonsil and adenoidectomy  1997   ??? Hx wisdom teeth extraction  2007   ??? Hx colonoscopy       high school       Social History:  History     Social History   ??? Marital Status: SINGLE     Spouse Name: N/A     Number of Children: N/A   ??? Years of Education: N/A     Occupational History   ??? Not on file.     Social History Main Topics   ??? Smoking status: Never Smoker    ??? Smokeless  tobacco: Never Used   ??? Alcohol Use: No   ??? Drug Use: No   ??? Sexually Active: Yes -- Female partner(s)     Birth Control/ Protection: Condom     Other Topics Concern   ??? Not on file     Social History Narrative   ??? No narrative on file       Family History:  Family History   Problem Relation Age of Onset   ??? Breast Cancer Mother    ??? Heart Disease Mother    ??? Migraines Mother    ??? Hypertension Mother    ??? Diabetes Father    ??? Asthma Brother    ??? Ovarian Cancer Paternal Grandmother            Ultrasound results:      BP 122/70   Pulse 99   Ht  5\' 6"  (1.676 m)   Wt 167 lb (75.751 kg)   BMI 26.97 kg/m2   SpO2 99%   LMP 01/31/2013  Review of Systems   Constitutional: Negative.    HENT: Negative.    Eyes: Negative.    Respiratory: Negative.    Cardiovascular: Negative.    Gastrointestinal: Negative.    Genitourinary: Negative.    Musculoskeletal: Negative.    Skin: Negative.    Neurological: Negative.    Endo/Heme/Allergies: Negative.    Psychiatric/Behavioral: Negative.        Physical Exam   Nursing note and vitals reviewed.  Constitutional: She is oriented to person, place, and time. Vital signs are normal. She appears well-developed and well-nourished. She is active and cooperative.  Non-toxic appearance. She does not have a sickly appearance. She does not appear ill. No distress.   HENT:   Head: Normocephalic and atraumatic.   Eyes: Right eye exhibits no discharge. Left eye exhibits no discharge. No scleral icterus.   Neck: Trachea normal, normal range of motion and phonation normal. Neck supple. No JVD present. No mass and no thyromegaly present.   Cardiovascular: Normal rate, regular rhythm and normal heart sounds.    Pulmonary/Chest: Effort normal and breath sounds normal. She exhibits no mass, no tenderness, no bony tenderness, no laceration, no crepitus, no edema, no deformity, no swelling and no retraction. Right breast exhibits no inverted nipple, no mass, no nipple discharge, no skin change and no tenderness.  Left breast exhibits no inverted nipple, no mass, no nipple discharge, no skin change and no tenderness.   Abdominal: Soft. Normal appearance and bowel sounds are normal. She exhibits no distension, no ascites, no pulsatile midline mass and no mass. There is no hepatosplenomegaly. There is no tenderness.   Genitourinary: Vagina normal. No breast swelling, tenderness, discharge or bleeding. Pelvic exam was performed with patient supine. No labial fusion. There is no rash, tenderness, lesion or injury on the right labia. There is no rash, tenderness, lesion or injury on the left labia. Uterus is enlarged. Uterus is not deviated, not fixed and not tender. Cervix exhibits no motion tenderness, no discharge and no friability. Right adnexum displays no mass, no tenderness and no fullness. Left adnexum displays no mass, no tenderness and no fullness. No erythema, tenderness or bleeding around the vagina. No foreign body around the vagina. No signs of injury around the vagina. No vaginal discharge found.   14w uterine size   Musculoskeletal: Normal range of motion. She exhibits no edema.   Lymphadenopathy:     She has no axillary adenopathy.        Right: No supraclavicular adenopathy present.        Left: No supraclavicular adenopathy present.   Neurological: She is alert and oriented to person, place, and time. She has normal strength. She displays no atrophy and no tremor. She exhibits normal muscle tone. She displays no seizure activity. Coordination and gait normal. GCS eye subscore is 4. GCS verbal subscore is 5. GCS motor subscore is 6.   Skin: Skin is warm and dry. No rash noted. She is not diaphoretic. No pallor. Nails show no clubbing.   Psychiatric: She has a normal mood and affect. Her speech is normal and behavior is normal. Judgment and thought content normal. Cognition and memory are normal.       ASSESSMENT and PLAN  12w [place] today-see report above. Referred to MFM due to twin gestation. Fibroid small and  stable (not visualized today). Retroplacental  bleed noted today and explained to pt. Threatened AB precautions explained. Pt is not cramping or bleeding today.  Cervical length is 5.5cm. Will follow throughout pregnancy due to increased risk of cervical shortening with multiple gestation.  Encounter Diagnoses   Name Primary?   ??? Supervision of normal first pregnancy in first trimester Yes   ??? Multiple gestation in first trimester    ??? Encounter for routine screening for malformation using ultrasonics      Orders Placed This Encounter   ??? AMB POC US OB TRANSVAGINAL   ??? US OB < 14 WKS, 1ST GESTATION (16109)   ??? REFERRAL TO PERINATOLOGY   ??? AMB POC URINALYSIS DIP STICK MANUAL W/O MICRO   ??? AMB POC HEMOGLOBIN (HGB)   ??? iron-FA#6-C-B12-biot-coppr-DSS (FERIVAFA) 110 mg-1 mg -175 mg-12 mcg cap   ??? PAP (IMAGE GUIDED) W/REFL HPV ALL PATHOLOGY ([id number])     Follow-up Disposition:  Return in about 4 weeks (around 05/22/2013), or if symptoms worsen or fail to improve, for 16w OBUS and visit, needs cervical length.

## 2013-04-24 NOTE — Patient Instructions (Addendum)
MyChart Activation    Thank you for requesting access to MyChart. Please follow the instructions below to securely access and download your online medical record. MyChart allows you to send messages to your doctor, view your test results, renew your prescriptions, schedule appointments, and more.    How Do I Sign Up?    1. In your internet browser, go to [email or web address]  2. Click on the First Time User? Click Here link in the Sign In box. You will be redirect to the New Member Sign Up page.  3. Enter your MyChart Access Code exactly as it appears below. You will not need to use this code after you???ve completed the sign-up process. If you do not sign up before the expiration date, you must request a new code.    MyChart Access Code: Not generated  Current MyChart Status: Active (This is the date your MyChart access code will expire)    4. Enter the last four digits of your Social Security Number (xxxx) and Date of Birth (mm/dd/yyyy) as indicated and click Submit. You will be taken to the next sign-up page.  5. Create a MyChart ID. This will be your MyChart login ID and cannot be changed, so think of one that is secure and easy to remember.  6. Create a MyChart password. You can change your password at any time.  7. Enter your Password Reset Question and Answer. This can be used at a later time if you forget your password.   8. Enter your e-mail address. You will receive e-mail notification when new information is available in MyChart.  9. Click Sign Up. You can now view and download portions of your medical record.  10. Click the Download Summary menu link to download a portable copy of your medical information.    Additional Information    If you have questions, please visit the Frequently Asked Questions section of the MyChart website at [email or web address] Remember, MyChart is NOT to be used for urgent needs. For medical emergencies, dial 911.      Learning About When to Call Your  Doctor During Pregnancy (Up to 20 Weeks)  Your Care Instructions  It's common to have concerns about what might be a problem during pregnancy. Although most pregnant women don't have any serious problems, it's important to know when to call your doctor if you have certain symptoms.  These are general suggestions. Your doctor may give you some more information about when to call.  When to call your doctor (up to 20 weeks)  Call 911 anytime you think you may need emergency care. For example, call if:  ?? You passed out (lost consciousness).  Call your doctor now or seek immediate medical care if:  ?? You have a fever.  ?? You have vaginal bleeding.  ?? You are dizzy or lightheaded, or you feel like you may faint.  ?? You have symptoms of a urinary tract infection. These may include:  ?? Pain or burning when you urinate.  ?? A frequent need to urinate without being able to pass much urine.  ?? Pain in the flank, which is just below the rib cage and above the waist on either side of the back.  ?? Blood in your urine.  ?? You have belly pain.  ?? You think you are having contractions.  ?? You have a sudden release of fluid from your vagina.  Watch closely for changes in your health, and be sure to contact your doctor if:  ??  You have vaginal discharge that smells bad.  ?? You have other concerns about your pregnancy.  Follow-up care is a key part of your treatment and safety. Be sure to make and go to all appointments, and call your doctor if you are having problems. It's also a good idea to know your test results and keep a list of the medicines you take.   Where can you learn more?   Go to [email or web address]  Enter [phone] in the search box to learn more about "Learning About When to Call Your Doctor During Pregnancy (Up to 20 Weeks)."   ?? 2006-2014 Healthwise, Incorporated. Care instructions adapted under license by [employer] (which disclaims liability or warranty for this information). This care instruction is for  use with your licensed healthcare professional. If you have questions about a medical condition or this instruction, always ask your healthcare professional. Healthwise, Incorporated disclaims any warranty or liability for your use of this information.  Content Version: 10.2.346038; Current as of: February 15, 2013              Weeks 10 to 14 of Your Pregnancy: After Your Visit  Your Care Instructions     By weeks 10 to 14 of your pregnancy, the placenta has formed inside your uterus. It is possible to hear your baby's heartbeat with a special ultrasound device. Your baby's eyes can and do move. The arms and legs can bend.  This is a good time to think about testing for birth defects. There are two types of tests: screening and diagnostic. Screening tests show the chance that a baby has a certain birth defect. They can't tell you for sure that your baby has a problem. Diagnostic tests show if a baby has a certain birth defect.  It's your choice whether to have these tests. You and your partner can talk to your doctor or midwife about birth defects tests.  Follow-up care is a key part of your treatment and safety. Be sure to make and go to all appointments, and call your doctor if you are having problems. It's also a good idea to know your test results and keep a list of the medicines you take.  How can you care for yourself at home?  Decide about tests  ?? You can have screening tests and diagnostic tests to check for birth defects. The decision to have a test for birth defects is personal. Think about your age, your chance of passing on a family disease, your need to know about any problems, and what you might do after you have the test results.  ?? Triple or quadruple (quad) blood tests. These screening tests can be done between 15 and 20 weeks of pregnancy. They check the amounts of three or four substances in your blood. The doctor looks at these test results, along with your age and other factors, to find out the  chance that your baby may have certain problems.  ?? Amniocentesis. This diagnostic test is used to look for chromosomal problems in the baby's cells. It can be done between 15 and 20 weeks of pregnancy, usually around week 16.  ?? Nuchal translucency test. This test uses ultrasound to measure the thickness of the area at the back of the baby's neck. An increase in the thickness can be an early sign of Down syndrome.  Ease discomfort  ?? Slow down and take naps when you feel tired.  ?? If your emotions swing, talk to someone. Crying, anxiety, and  concentration problems are common.  ?? If your gums bleed, try a softer toothbrush. If your gums are puffy and bleed a lot, see your dentist.  ?? If you feel dizzy:  ?? Get up slowly after sitting or lying down.  ?? Drink plenty of fluids.  ?? Eat small snacks to keep your blood sugar stable.  ?? Put your head between your legs as though you were tying your shoelaces.  ?? Lie down with your legs higher than your head. Use pillows to prop up your feet.  ?? If you have a headache:  ?? Lie down.  ?? Ask your partner or a good friend for a neck massage.  ?? Try cool cloths over your forehead or across the back of your neck.  ?? Use acetaminophen (Tylenol) for pain relief. Do not use nonsteroidal anti-inflammatory drugs (NSAIDs), such as ibuprofen (Advil, Motrin) or naproxen (Aleve), unless your doctor says it is okay.  ?? If you have a nosebleed, pinch your nose gently, and hold it for a short while. To prevent nosebleeds, try massaging a small dab of petroleum jelly, such as Vaseline, in your nostrils.  ?? If your nose is stuffed up, try saline (saltwater) nose sprays. Do not use decongestant sprays.  Care for your breasts  ?? Wear a bra that gives you good support.  ?? Know that changes in your breasts are normal.  ?? Your breasts may get larger and more tender. Tenderness usually gets better by 12 weeks.  ?? Your nipples may get darker and larger, and small bumps around your nipples may show  more.  ?? The veins in your chest and breasts may show more.  ?? Don't worry about "toughening'" your nipples. Breast-feeding will naturally do this.   Where can you learn more?   Go to [email or web address]  Enter [id number] in the search box to learn more about "Weeks 10 to 14 of Your Pregnancy: After Your Visit."   ?? 2006-2014 Healthwise, Incorporated. Care instructions adapted under license by [employer] (which disclaims liability or warranty for this information). This care instruction is for use with your licensed healthcare professional. If you have questions about a medical condition or this instruction, always ask your healthcare professional. Healthwise, Incorporated disclaims any warranty or liability for your use of this information.  Content Version: 10.2.346038; Current as of: February 15, 2013

## 2013-05-26 LAB — AMB POC URINALYSIS DIP STICK MANUAL W/O MICRO: Glucose (UA POC): NEGATIVE

## 2013-05-26 NOTE — Progress Notes (Signed)
See PN Flowsheet      Discussed that delivery will depend on the lie of the fetuses at time of delivery.  We encourage vaginal delivery if risks of lie are favorable.  Pt does understand that the second fetus may flip even if vtx prior to delivery of first twin and not be able to return to vtx requiring a CS.  Pt thinks she wants to try for vaginal deliveries and will think it over.  Understands she is at higher risk with multiples for PIH, GDM, PTL, PPROM.  Pt has 20w screen with MFM in 3w.

## 2013-05-26 NOTE — Patient Instructions (Signed)
MyChart Activation    Thank you for requesting access to MyChart. Please follow the instructions below to securely access and download your online medical record. MyChart allows you to send messages to your doctor, view your test results, renew your prescriptions, schedule appointments, and more.    How Do I Sign Up?    1. In your internet browser, go to [email or web address]  2. Click on the First Time User? Click Here link in the Sign In box. You will be redirect to the New Member Sign Up page.  3. Enter your MyChart Access Code exactly as it appears below. You will not need to use this code after you???ve completed the sign-up process. If you do not sign up before the expiration date, you must request a new code.    MyChart Access Code: Not generated  Current MyChart Status: Active (This is the date your MyChart access code will expire)    4. Enter the last four digits of your Social Security Number (xxxx) and Date of Birth (mm/dd/yyyy) as indicated and click Submit. You will be taken to the next sign-up page.  5. Create a MyChart ID. This will be your MyChart login ID and cannot be changed, so think of one that is secure and easy to remember.  6. Create a MyChart password. You can change your password at any time.  7. Enter your Password Reset Question and Answer. This can be used at a later time if you forget your password.   8. Enter your e-mail address. You will receive e-mail notification when new information is available in MyChart.  9. Click Sign Up. You can now view and download portions of your medical record.  10. Click the Download Summary menu link to download a portable copy of your medical information.    Additional Information    If you have questions, please visit the Frequently Asked Questions section of the MyChart website at [email or web address] Remember, MyChart is NOT to be used for urgent needs. For medical emergencies, dial 911.

## 2013-07-03 LAB — AMB POC URINALYSIS DIP STICK MANUAL W/O MICRO: Glucose (UA POC): NEGATIVE

## 2013-07-03 NOTE — Patient Instructions (Signed)
Weeks 22 to 26 of Your Pregnancy: After Your Visit  Your Care Instructions     As you enter your 7th month of pregnancy at week 26, your baby's lungs are growing stronger and getting ready to breathe. You may notice that your baby responds to the sound of your or your partner's voice. You may also notice that your baby does less turning and twisting and more squirming or jerking. Jerking often means that your baby has the hiccups. Hiccups are perfectly normal and are only temporary.  You may want to think about attending a childbirth preparation class. This is also a good time to start thinking about whether you want to have pain medicine during labor.  Most pregnant women are tested for gestational diabetes between weeks 24 and 28. Gestational diabetes occurs when your blood sugar level gets too high when you're pregnant. The test is important, because you can have gestational diabetes and not know it. But the condition can cause problems for your baby.  Follow-up care is a key part of your treatment and safety. Be sure to make and go to all appointments, and call your doctor if you are having problems. It's also a good idea to know your test results and keep a list of the medicines you take.  How can you care for yourself at home?  Ease discomfort from your baby's kicking  ?? Change your position. Sometimes this will cause your baby to change position too.  ?? Take a deep breath while you raise your arm over your head. Then breathe out while you drop your arm.  Do Kegel exercises to prevent urine from leaking  ?? You can do Kegel exercises while you stand or sit.  ?? Squeeze the same muscles you would use to stop your urine. Your belly and thighs should not move.  ?? Hold the squeeze for 3 seconds, and then relax for 3 seconds.  ?? Start with 3 seconds. Then add 1 second each week until you are able to squeeze for 10 seconds.  ?? Repeat the exercise 10 to 15 times for each session. Do three or more sessions each day.   Ease or reduce swelling in your feet, ankles, hands, and fingers  ?? If your fingers are puffy, take off your rings.  ?? Do not eat high-salt foods, such as potato chips.  ?? Prop up your feet on a stool or couch as much as possible. Sleep with pillows under your feet.  ?? Do not stand for long periods of time or wear tight shoes.  ?? Wear support stockings.   Where can you learn more?   Go to http://www.healthwise.net/[email or web address]  Enter G264 in the search box to learn more about "Weeks 22 to 26 of Your Pregnancy: After Your Visit."   ?? 2006-2014 Healthwise, Incorporated. Care instructions adapted under license by [employer] (which disclaims liability or warranty for this information). This care instruction is for use with your licensed healthcare professional. If you have questions about a medical condition or this instruction, always ask your healthcare professional. Healthwise, Incorporated disclaims any warranty or liability for your use of this information.  Content Version: 10.1.311062; Current as of: April 08, 2012

## 2013-07-03 NOTE — Progress Notes (Signed)
See prenatal flowsheet.

## 2013-07-24 LAB — AMB POC URINALYSIS DIP STICK MANUAL W/ MICRO: Glucose (UA POC): NEGATIVE

## 2013-07-24 NOTE — Patient Instructions (Addendum)
MyChart Activation    Thank you for requesting access to MyChart. Please follow the instructions below to securely access and download your online medical record. MyChart allows you to send messages to your doctor, view your test results, renew your prescriptions, schedule appointments, and more.    How Do I Sign Up?    1. In your internet browser, go to [email or web address]  2. Click on the First Time User? Click Here link in the Sign In box. You will be redirect to the New Member Sign Up page.  3. Enter your MyChart Access Code exactly as it appears below. You will not need to use this code after you???ve completed the sign-up process. If you do not sign up before the expiration date, you must request a new code.    MyChart Access Code: Not generated  Current MyChart Status: Active (This is the date your MyChart access code will expire)    4. Enter the last four digits of your Social Security Number (xxxx) and Date of Birth (mm/dd/yyyy) as indicated and click Submit. You will be taken to the next sign-up page.  5. Create a MyChart ID. This will be your MyChart login ID and cannot be changed, so think of one that is secure and easy to remember.  6. Create a MyChart password. You can change your password at any time.  7. Enter your Password Reset Question and Answer. This can be used at a later time if you forget your password.   8. Enter your e-mail address. You will receive e-mail notification when new information is available in MyChart.  9. Click Sign Up. You can now view and download portions of your medical record.  10. Click the Download Summary menu link to download a portable copy of your medical information.    Additional Information    If you have questions, please visit the Frequently Asked Questions section of the MyChart website at [email or web address] Remember, MyChart is NOT to be used for urgent needs. For medical emergencies, dial 911.        Weeks 26 to 30 of Your Pregnancy:  After Your Visit  Your Care Instructions     You are now in your last trimester of pregnancy. Your baby is growing rapidly. And you'll probably feel your baby moving around more often. Your back may ache as your body gets used to your baby's size and length.  If you haven't already had the Tdap shot during this pregnancy, talk to your doctor about getting it. It will help protect your newborn against pertussis infection.  During this time, it's important to take care of yourself and pay attention to what your body needs. If you feel sexual, explore ways to be close with your partner that match your comfort and desire. Use the tips provided in this care sheet to find ways to be sexual in your own way.  Follow-up care is a key part of your treatment and safety. Be sure to make and go to all appointments, and call your doctor if you are having problems. It's also a good idea to know your test results and keep a list of the medicines you take.  How can you care for yourself at home?  Take it easy at work  ?? Take frequent breaks. If possible, stop working when you are tired, and rest during your lunch hour.  ?? Take bathroom breaks every 2 hours.  ?? Change positions often. If you sit for long periods, stand up and   walk around.  ?? When you stand for a long time, keep one foot on a low stool with your knee bent. After standing a lot, sit with your feet up.  ?? Avoid fumes, chemicals, and tobacco smoke.  Be sexual in your own way  ?? Having sex during pregnancy is okay, unless your doctor tells you not to.  ?? You may be very interested in sex, or you may have no interest at all.  ?? Your growing belly can make it hard to find a good position during intercourse. Experiment and explore.  ?? You may get cramps in your uterus when your partner touches your breasts.  ?? A back rub may relieve the backache or cramps that sometimes follow orgasm.  Learn about preterm labor  ?? Watch for signs of preterm labor. You may be going into labor  if:  ?? You have menstrual-like cramps, with or without nausea.  ?? You have about 4 or more contractions in 20 minutes, or about 8 or more within 1 hour, even after you have had a glass of water and are resting.  ?? You have a low, dull backache that does not go away when you change your position.  ?? You have pain or pressure in your pelvis that comes and goes in a pattern.  ?? You have intestinal cramping or flu-like symptoms, with or without diarrhea.  ?? You notice an increase or change in your vaginal discharge. Discharge may be heavy, mucus-like, watery, or streaked with blood.  ?? Your water breaks.  ?? If you think you have preterm labor:  ?? Go to the bathroom.  ?? Drink 2 or 3 glasses of water or juice.  ?? Lie down on your left side. Do not lie on your back.  ?? While lying on your side, find your breast bone. Put your fingers in the soft spot just below it. Move your fingers down toward your belly button to find the top of your uterus. Check to see if it is tight.  ?? Contractions can be weak or strong. Record your contractions for an hour. Time a contraction from the start of one contraction to the start of the next one.  ?? Single or several strong contractions without a pattern are called Braxton-Hicks contractions. They are practice contractions but not the start of labor. They often stop if you change what you are doing.  ?? Call your doctor if you have regular contractions.   Where can you learn more?   Go to http://www.healthwise.net/[facility]  Enter S999 in the search box to learn more about "Weeks 26 to 30 of Your Pregnancy: After Your Visit."   ?? 2006-2014 Healthwise, Incorporated. Care instructions adapted under license by [employer] (which disclaims liability or warranty for this information). This care instruction is for use with your licensed healthcare professional. If you have questions about a medical condition or this instruction, always ask your healthcare professional. Healthwise, Incorporated  disclaims any warranty or liability for your use of this information.  Content Version: 10.2.346038; Current as of: February 15, 2013

## 2013-08-09 LAB — GLUCOSE, 1 HR: GTT, 1 hr, Glucola, External: 109

## 2013-08-14 LAB — AMB POC URINALYSIS DIP STICK MANUAL W/ MICRO
Glucose (UA POC): NEGATIVE
Protein (UA POC): NEGATIVE mg/dL

## 2013-08-14 LAB — HEMOGLOBIN: Hgb, External: 11.1

## 2013-08-14 LAB — AMB POC HEMOGLOBIN (HGB): Hemoglobin (POC): 11.1

## 2013-08-14 NOTE — Progress Notes (Signed)
See prenatal flowsheet. Ultrasound done today for growth and cervical length. Baby A is 3lb 4oz (72%ile). Baby B is 2lb 11oz (52%ile). CL is 3.3-3.4cm. GTT done today as well. Ashley Pearson is doing well without complaints. She is not experiencing contractions and has active fetal movement x2. She has been traveling from [place] to [place] for her visits. She is a Runner, broadcasting/film/video. I have advised her that once the children let out for Christmas break in a few weeks that I would like her to not travel that far for the remainder of her pregnancy due to her twin pregnancy. Discussed the increased risk of PTL, blood clots with prolonged sitting/travel, and distance from hospital not being optimal. Her visits will also become more frequent this trimester. [name] agrees.

## 2013-08-14 NOTE — Patient Instructions (Addendum)
MyChart Activation    Thank you for requesting access to MyChart. Please follow the instructions below to securely access and download your online medical record. MyChart allows you to send messages to your doctor, view your test results, renew your prescriptions, schedule appointments, and more.    How Do I Sign Up?    1. In your internet browser, go to [email or web address]  2. Click on the First Time User? Click Here link in the Sign In box. You will be redirect to the New Member Sign Up page.  3. Enter your MyChart Access Code exactly as it appears below. You will not need to use this code after you???ve completed the sign-up process. If you do not sign up before the expiration date, you must request a new code.    MyChart Access Code: Not generated  Current MyChart Status: Active (This is the date your MyChart access code will expire)    4. Enter the last four digits of your Social Security Number (xxxx) and Date of Birth (mm/dd/yyyy) as indicated and click Submit. You will be taken to the next sign-up page.  5. Create a MyChart ID. This will be your MyChart login ID and cannot be changed, so think of one that is secure and easy to remember.  6. Create a MyChart password. You can change your password at any time.  7. Enter your Password Reset Question and Answer. This can be used at a later time if you forget your password.   8. Enter your e-mail address. You will receive e-mail notification when new information is available in MyChart.  9. Click Sign Up. You can now view and download portions of your medical record.  10. Click the Download Summary menu link to download a portable copy of your medical information.    Additional Information    If you have questions, please visit the Frequently Asked Questions section of the MyChart website at [email or web address] Remember, MyChart is NOT to be used for urgent needs. For medical emergencies, dial 911.      Weeks 26 to 30 of Your Pregnancy:  After Your Visit  Your Care Instructions     You are now in your last trimester of pregnancy. Your baby is growing rapidly. And you'll probably feel your baby moving around more often. Your back may ache as your body gets used to your baby's size and length.  If you haven't already had the Tdap shot during this pregnancy, talk to your doctor about getting it. It will help protect your newborn against pertussis infection.  During this time, it's important to take care of yourself and pay attention to what your body needs. If you feel sexual, explore ways to be close with your partner that match your comfort and desire. Use the tips provided in this care sheet to find ways to be sexual in your own way.  Follow-up care is a key part of your treatment and safety. Be sure to make and go to all appointments, and call your doctor if you are having problems. It's also a good idea to know your test results and keep a list of the medicines you take.  How can you care for yourself at home?  Take it easy at work  ?? Take frequent breaks. If possible, stop working when you are tired, and rest during your lunch hour.  ?? Take bathroom breaks every 2 hours.  ?? Change positions often. If you sit for long periods, stand up and walk around.  ??  When you stand for a long time, keep one foot on a low stool with your knee bent. After standing a lot, sit with your feet up.  ?? Avoid fumes, chemicals, and tobacco smoke.  Be sexual in your own way  ?? Having sex during pregnancy is okay, unless your doctor tells you not to.  ?? You may be very interested in sex, or you may have no interest at all.  ?? Your growing belly can make it hard to find a good position during intercourse. Experiment and explore.  ?? You may get cramps in your uterus when your partner touches your breasts.  ?? A back rub may relieve the backache or cramps that sometimes follow orgasm.  Learn about preterm labor  ?? Watch for signs of preterm labor. You may be going into labor  if:  ?? You have menstrual-like cramps, with or without nausea.  ?? You have about 4 or more contractions in 20 minutes, or about 8 or more within 1 hour, even after you have had a glass of water and are resting.  ?? You have a low, dull backache that does not go away when you change your position.  ?? You have pain or pressure in your pelvis that comes and goes in a pattern.  ?? You have intestinal cramping or flu-like symptoms, with or without diarrhea.  ?? You notice an increase or change in your vaginal discharge. Discharge may be heavy, mucus-like, watery, or streaked with blood.  ?? Your water breaks.  ?? If you think you have preterm labor:  ?? Go to the bathroom.  ?? Drink 2 or 3 glasses of water or juice.  ?? Lie down on your left side. Do not lie on your back.  ?? While lying on your side, find your breast bone. Put your fingers in the soft spot just below it. Move your fingers down toward your belly button to find the top of your uterus. Check to see if it is tight.  ?? Contractions can be weak or strong. Record your contractions for an hour. Time a contraction from the start of one contraction to the start of the next one.  ?? Single or several strong contractions without a pattern are called Braxton-Hicks contractions. They are practice contractions but not the start of labor. They often stop if you change what you are doing.  ?? Call your doctor if you have regular contractions.   Where can you learn more?   Go to [email or web address]  Enter [phone] in the search box to learn more about "Weeks 26 to 30 of Your Pregnancy: After Your Visit."   ?? 2006-2014 Healthwise, Incorporated. Care instructions adapted under license by [employer] (which disclaims liability or warranty for this information). This care instruction is for use with your licensed healthcare professional. If you have questions about a medical condition or this instruction, always ask your healthcare professional. Healthwise, Incorporated  disclaims any warranty or liability for your use of this information.  Content Version: 10.2.346038; Current as of: February 15, 2013

## 2013-09-04 LAB — AMB POC URINALYSIS DIP STICK MANUAL W/ MICRO: Glucose (UA POC): NEGATIVE

## 2013-09-04 NOTE — Progress Notes (Signed)
See prenatal flowsheet. Patient was not scheduled for growth today. Will f/u in one week for growth [place] and BPPs. Two distinct separate FHTs heard with two dopplers simultaneously. Ashley Pearson denies complaints and has no PTL S&S. She states that she feels like she is full-term. Explained that with twins, she is measuring (fundal height) as though she is [redacted] weeks pregnant and that she may begin to experience braxton hicks or normal discomforts of pregnancy associated with a twin pregnancy at this gestation. Ashley Pearson is now staying in [place]. FMLA/STD papers prepared by Abagail Kitchens.

## 2013-09-04 NOTE — Patient Instructions (Addendum)
MyChart Activation    Thank you for requesting access to MyChart. Please follow the instructions below to securely access and download your online medical record. MyChart allows you to send messages to your doctor, view your test results, renew your prescriptions, schedule appointments, and more.    How Do I Sign Up?    1. In your internet browser, go to [email or web address]  2. Click on the First Time User? Click Here link in the Sign In box. You will be redirect to the New Member Sign Up page.  3. Enter your MyChart Access Code exactly as it appears below. You will not need to use this code after you???ve completed the sign-up process. If you do not sign up before the expiration date, you must request a new code.    MyChart Access Code: Not generated  Current MyChart Status: Active (This is the date your MyChart access code will expire)    4. Enter the last four digits of your Social Security Number (xxxx) and Date of Birth (mm/dd/yyyy) as indicated and click Submit. You will be taken to the next sign-up page.  5. Create a MyChart ID. This will be your MyChart login ID and cannot be changed, so think of one that is secure and easy to remember.  6. Create a MyChart password. You can change your password at any time.  7. Enter your Password Reset Question and Answer. This can be used at a later time if you forget your password.   8. Enter your e-mail address. You will receive e-mail notification when new information is available in MyChart.  9. Click Sign Up. You can now view and download portions of your medical record.  10. Click the Download Summary menu link to download a portable copy of your medical information.    Additional Information    If you have questions, please visit the Frequently Asked Questions section of the MyChart website at [email or web address] Remember, MyChart is NOT to be used for urgent needs. For medical emergencies, dial 911.      Weeks 30 to 32 of Your Pregnancy:  After Your Visit  Your Care Instructions     You have made it to the final months of your pregnancy. By now, your baby is really starting to look like a baby, with hair and plump skin.  As you enter the final weeks of pregnancy, the reality of having a baby may start to set in. This is the time to settle on a name, get your household in order, set up a safe nursery, and find quality child care if needed. Doing these things in advance will allow you to focus on caring for and enjoying your new baby. You may also want to have a tour of your hospital's labor and delivery unit to get a better idea of what to expect while you are in the hospital.  During these last months, it is very important to take good care of yourself and pay attention to what your body needs. If your doctor says it is okay for you to work, don't push yourself too hard. Use the tips provided in this care sheet to ease heartburn and care for varicose veins.  If you haven't already had the Tdap shot during this pregnancy, talk to your doctor about getting it. It will help protect your newborn against pertussis infection.  Follow-up care is a key part of your treatment and safety. Be sure to make and go to all appointments, and call your  doctor if you are having problems. It's also a good idea to know your test results and keep a list of the medicines you take.  How can you care for yourself at home?  Pay attention to your baby's movements  ?? You should feel your baby move several times every day.  ?? Your baby now turns less, and kicks and jabs more.  ?? Your baby sleeps 20 to 45 minutes at a time and is more active at certain times of day.  ?? If your doctor wants you to count your baby's kicks:  ?? Empty your bladder, and lie on your side or relax in a comfortable chair.  ?? Write down your start time.  ?? Pay attention only to your baby's movements. Count any movement except hiccups.  ?? After you have counted 10 movements, write down your stop time.  ??  Write down how many minutes it took for your baby to move 10 times.  ?? If an hour goes by and you have not recorded 10 movements, have something to eat or drink and then count for another hour. If you do not record 10 movements in either hour, call your doctor.  Ease heartburn  ?? Eat small, frequent meals.  ?? Do not eat chocolate, peppermint, or very spicy foods. Avoid drinks with caffeine, such as coffee, tea, and sodas.  ?? Avoid bending over or lying down after meals.  ?? Talk a short walk after you eat.  ?? If heartburn is a problem at night, do not eat for 2 hours before bedtime.  ?? Take antacids like Mylanta, Maalox, Rolaids, or Tums. Do not take antacids that have sodium bicarbonate.  Care for varicose veins  ?? Varicose veins are blood vessels that stretch out with the extra blood during pregnancy. Your legs may ache or throb. Most varicose veins will go away after the birth.  ?? Avoid standing for long periods of time. Sit with your legs crossed at the ankles, not the knees.  ?? Sit with your feet propped up.  ?? Avoid tight clothing or stockings. Wear support hose.  ?? Exercise regularly. Try walking for at least 30 minutes a day.   Where can you learn more?   Go to [email or web address]  Enter X471 in the search box to learn more about "Weeks 30 to 32 of Your Pregnancy: After Your Visit."   ?? 2006-2014 Healthwise, Incorporated. Care instructions adapted under license by [employer] (which disclaims liability or warranty for this information). This care instruction is for use with your licensed healthcare professional. If you have questions about a medical condition or this instruction, always ask your healthcare professional. Healthwise, Incorporated disclaims any warranty or liability for your use of this information.  Content Version: 10.2.346038; Current as of: February 15, 2013

## 2013-09-11 LAB — AMB POC URINALYSIS DIP STICK MANUAL W/ MICRO: Glucose (UA POC): NEGATIVE

## 2013-09-12 NOTE — Progress Notes (Signed)
Pt desired follow up growth ultrasound to be done here at [facility] since she is traveling >3 hours per visit from [place]. No IUGR or discordance on ultrasound today. CL 4cm without funneling. Need to notify [facility].  Will f/u with them as needed. Mild cramping today. Not consistent and CL good. Reviewed POC with Dr. Wilhemina Bonito in the office and she agrees.

## 2013-09-12 NOTE — Progress Notes (Signed)
See prenatal flowsheet. Ashley Pearson is doing well. [place] for growth and CL today. Baby A 2lb 2oz (76%ile), Baby B 1lb 15oz (63%ile). CL 3.8cm. GTT next visit.

## 2013-09-12 NOTE — Progress Notes (Signed)
See prenatal flowsheet. Growth done today. Babies are 4lb7oz and 4lb8oz. No IUGR or discordance present at this visit. BPP 8/8 on Baby A and Baby B. Cervical lenth at 28w was 3.3-3.4. Today CL is shortened to 1.9 without funneling. Ashley Pearson is not experiencing any contractions, cramping, bleeding, or leaking of fluid. She expresses that she feels pressure in her whole pelvis. Reviewed in depth with her the signs and symptoms of PTL. Ashley Pearson is now living here with her Mom and is not working. 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 She had been to [facility] for her anatomy scan but did not desire to return unless there were complications. After reviewing/approving this with Dr. Wilhemina Bonito, I advised her at that time that we could do her growth scans and she could f/u with MFM in the 3rd trimester when she moved here and did not have to travel >3 hours for each of her visits. She agreed and understands that now is the time to return to MFM due to her shortened cervix. In the absence of PTL symptoms, I have given her strict precautions to rest until evaluated by MFM and to go to the hospital for contractions, LOF, bleeding, or decreased FM. I have also reviewed with her that both babies are breech at this time and that should she labor, she would need a cesarean. I have reviewed all of this with Dr. Sharilyn Sites prior to [facility] leaving the office. She agrees with the POC. Ashley Pearson needs to schedule her next visit with an MD within [facility] for her 3rd trimester MD visit.

## 2013-09-12 NOTE — Patient Instructions (Signed)
Learning About When to Call Your Doctor During Pregnancy (After 20 Weeks)  Your Care Instructions  It's common to have concerns about what might be a problem during pregnancy. Although most pregnant women don't have any serious problems, it's important to know when to call your doctor if you have certain symptoms or signs of labor.  These are general suggestions. Your doctor may give you some more information about when to call.  When to call your doctor (after 20 weeks)  Call 911 anytime you think you may need emergency care. For example, call if:  ?? You have severe vaginal bleeding.  ?? You have sudden, severe pain in your belly.  ?? You passed out (lost consciousness).  ?? You have a seizure.  ?? You see or feel the umbilical cord.  ?? You think you are about to deliver your baby and can't make it safely to the hospital.  Call your doctor now or seek immediate medical care if:  ?? You have vaginal bleeding.  ?? You have belly pain.  ?? You have a fever.  ?? You have symptoms of preeclampsia, such as:  ?? Sudden swelling of your face, hands, or feet.  ?? New vision problems (such as dimness or blurring).  ?? A severe headache.  ?? You have a sudden release of fluid from your vagina. (You think your water broke.)  ?? You think that you may be in labor. This means that you've had at least 4 contractions within 20 minutes or at least 8 contractions in an hour.  ?? You notice that your baby has stopped moving or is moving much less than normal.  ?? You have symptoms of a urinary tract infection. These may include:  ?? Pain or burning when you urinate.  ?? A frequent need to urinate without being able to pass much urine.  ?? Pain in the flank, which is just below the rib cage and above the waist on either side of the back.  ?? Blood in your urine.  Watch closely for changes in your health, and be sure to contact your doctor if:  ?? You have vaginal discharge that smells bad.  ?? You have skin changes, such as:  ?? A rash.  ?? Itching.  ??  Yellow color to your skin.  ?? You have other concerns about your pregnancy.  If you have labor signs at 37 weeks or more  If you have signs of labor at 37 weeks or more, your doctor may tell you to call when your labor becomes more active. Symptoms of active labor include:  ?? Contractions that are regular.  ?? Contractions that are less than 5 minutes apart.  ?? Contractions that are hard to talk through.  Follow-up care is a key part of your treatment and safety. Be sure to make and go to all appointments, and call your doctor if you are having problems. It's also a good idea to know your test results and keep a list of the medicines you take.   Where can you learn more?   Go to http://www.healthwise.net/[email or web address]  Enter N531 in the search box to learn more about "Learning About When to Call Your Doctor During Pregnancy (After 20 Weeks)."   ?? 2006-2014 Healthwise, Incorporated. Care instructions adapted under license by [place] (which disclaims liability or warranty for this information). This care instruction is for use with your licensed healthcare professional. If you have questions about a medical condition or this instruction, always ask   your healthcare professional. Healthwise, Incorporated disclaims any warranty or liability for your use of this information.  Content Version: 10.2.346038; Current as of: February 15, 2013              Preterm Labor: After Your Visit  Your Care Instructions  Preterm labor is the start of labor between 20 and 36 weeks of pregnancy. A full-term pregnancy lasts 37 to 42 weeks. In labor, the uterus contracts to open the cervix. This is the first stage of childbirth. Preterm labor can be caused by a problem with the baby, the mother, or both. Often the cause is not known.  In some cases, doctors use medicines to try to delay labor until 34 or more weeks of pregnancy. By this time, a baby has grown enough so that problems are not likely. In some cases???such as with a serious  infection???it is healthier for the baby to be born early. Your treatment will depend on how far along you are in your pregnancy and on your health and your baby's health.  Follow-up care is a key part of your treatment and safety. Be sure to make and go to all appointments, and call your doctor if you are having problems. It's also a good idea to know your test results and keep a list of the medicines you take.  How can you care for yourself at home?  ?? If your doctor prescribed medicines, take them exactly as directed. Call your doctor if you think you are having a problem with your medicine.  ?? Rest until your doctor advises you about activity. He or she will tell you if you should stay in bed most of the time. You may need to arrange for child care if you have young children.  ?? Do not have sexual intercourse unless your doctor says it is safe.  ?? Use pads, not tampons, if you have vaginal bleeding.  ?? Make sure to drink plenty of fluids. Dehydration can lead to contractions. If you have kidney, heart, or liver disease and have to limit fluids, talk with your doctor before you increase the amount of fluids you drink.  ?? Do not smoke or allow others to smoke around you. If you need help quitting, talk to your doctor about stop-smoking programs and medicines. These can increase your chances of quitting for good.  When should you call for help?  Call 911 anytime you think you may need emergency care. For example, call if:  ?? You passed out (lost consciousness).  ?? You have severe vaginal bleeding.  ?? You have severe pain in your belly or pelvis.  ?? You have had fluid gushing or leaking from your vagina and you know or think the umbilical cord is bulging into your vagina. If this happens, immediately get down on your knees so your rear end (buttocks) is higher than your head. This will decrease the pressure on the cord until help arrives.  Call your doctor now or seek immediate medical care if:  ?? You have signs of  preeclampsia, such as:  ?? Sudden swelling of your face, hands, or feet.  ?? New vision problems (such as dimness or blurring).  ?? A severe headache.  ?? You have any vaginal bleeding.  ?? You have belly pain or cramping.  ?? You have a fever.  ?? You have had regular contractions (with or without pain) for an hour. This means that you have 8 or more within 1 hour or 4 or  more in 20 minutes after you change your position and drink fluids.  ?? You have a sudden release of fluid from the vagina.  ?? You have low back pain or pelvic pressure that does not go away.  ?? You notice that your baby has stopped moving or is moving much less than normal.  Watch closely for changes in your health, and be sure to contact your doctor if you have any problems.   Where can you learn more?   Go to [email or web address]  Enter Q400 in the search box to learn more about "Preterm Labor: After Your Visit."   ?? 2006-2014 Healthwise, Incorporated. Care instructions adapted under license by [employer] (which disclaims liability or warranty for this information). This care instruction is for use with your licensed healthcare professional. If you have questions about a medical condition or this instruction, always ask your healthcare professional. Healthwise, Incorporated disclaims any warranty or liability for your use of this information.  Content Version: 10.2.346038; Current as of: February 15, 2013

## 2013-09-14 NOTE — L&D Delivery Note (Addendum)
Delivery Note    Obstetrician:  Dr. Ivan Anchors    Assistant: Melodye Ped, [facility], CNM    Pre-Delivery Diagnosis: Preterm pregnancy <37 weeks, Spontaneous labor, Multifetal pregnancy or Pregnancy complicated by: PTL with twins    Post-Delivery Diagnosis: Living newborn infant(s) or Female baby A, Female baby B    Intrapartum Event: BPP identified as 2/8 on both babies before it was identified that BPP was likely a result of patient laboring. Cervical change noted after BPPs completed.    Procedure: Primary Low Transverse Cesarean section    Epidural: NO: Spinal    Monitor:  Fetal Heart Tones - External and Uterine Contractions - External    Indications for instrumental delivery: none    Estimated Blood Loss:    Episiotomy: n/a    Laceration(s):  n/a    Laceration(s) repair: NO: N/A    Presentation: Breech Baby A, Transverse Baby B    Fetal Description: multiple gestation 2    Fetal Position: see above    BABY INFORMATION  NAME:   Information for the patient's newborn:  MITZY, NARON Boy A [[id number]]   Baby Boy A Mcmanus  Information for the patient's newborn:  MARDIE, KELLEN [[id number]]   Baby Boy B [place]      SEX: female  DATE AND TIME OF BIRTH:   Information for the patient's newborn:  ISIDORA, LAHAM Boy A [[id number]]   10/02/2013  Information for the patient's newborn:  DENEENE, TARVER Boy B [[id number]]   10/02/2013     at   Information for the patient's newborn:  SHAROL, CROGHAN Boy A [[id number]]    9:16 PM  Information for the patient's newborn:  JUDYTHE, POSTEMA Boy B [[id number]]    9:18 PM      BIRTH MEASUREMENTS:   Information for the patient's newborn:  LILYANN, GRAVELLE Boy A [[id number]]      Information for the patient's newborn:  LAQUAN, LUDDEN Boy B [[id number]]         and   Information for the patient's newborn:  CECILIA, VANCLEVE Boy A [[id number]]      Information for the patient's newborn:  LASHAWNA, POCHE Boy B [[id number]]        .  APGARS:  Information for the patient's newborn:  ERI, MCEVERS  Boy A [[id number]]   One Minute Apgar: 4 (Filed from Delivery Summary)  Information for the patient's newborn:  YESSICA, PUTNAM [[id number]]   One Minute Apgar: 6 (Filed from Delivery Summary)       Information for the patient's newborn:  HARLIE, BUENING Boy A [[id number]]   Five  Minute Apgar: 9 (Filed from Delivery Summary)  Information for the patient's newborn:  KENZLI, BARRITT [[id number]]   Five  Minute Apgar: 8 (Filed from Delivery Summary)        Umbilical Cord: 3 vessels present and Cord blood sent to lab for type, Rh, and Coombs' test    Specimens: uterine fibroid            Complications:  Preterm labor with twins at [redacted]w[redacted]d gestation           Lab Results   Component Value Date/Time    ABO/Rh(D) A POSITIVE 10/01/2013 11:20 PM    Antibody screen NEG 10/01/2013 11:20 PM    Antibody screen NEG 03/16/2013            Attending Attestation: Dr. Tacey Ruiz was present and scrubbed for the entire procedure. I assisted him and was  present and scrubbed for the entire procedure.      Delivery Summary    Patient: Ashley Pearson MRN: [id number]  SSN: [id number]    Date of Birth: [birth year]  Age: 28 y.o.  Sex: female        Information for the patient's newborn:  SHATARA, STANEK Boy A [[id number]]       Labor Events:   Preterm Labor: Yes   Rupture Date: 10/02/2013   Rupture Time: 9:15 PM   Rupture Type: AROM   Amniotic Fluid Volume: Large    Amniotic Fluid Description: Clear    Induction: None       Augmentation: None   Labor Events:       Cervical Ripening:   None     Delivery Events:  Estimated Blood Loss (ml):     Delivery Date: 10/02/2013    Delivery Time: 9:16 PM  Delivery Type: Low Transverse C-Section   Cesarean Section Delivery:          Incidence: Primary   Scheduled: Unscheduled     Sex:  Female    Gestational Age: 25.4 weeks.  Delivery Clinician:  Lorin Picket Penni Penado  Living Status: Yes  Delivery Location: OR            APGARS  One minute Five minutes Ten minutes   Skin Color: 0   2   2     Heart Rate: 2    2   2      Reflex Irritability: 1   2   2      Muscle Tone: 0   2   2     Respiration: 1   1   1      Total: 4  9  9       Presentation: Breech    Position: Left Sacrum Transverse  Resuscitation Method:  Tactile Stimulation  Suctioning-bulb  Other (Comment)  PPV provided by NNP at delivery   Meconium Stained: None      Cord Information: 3 Vessels      Cord Events: None      Cord Blood Sent?:  Yes    Blood Gases Sent?:  Yes    Placenta:  Date: 10/02/2013   Time: 9:20 PM  Removal: Expressed    Appearance: Normal  Intact  Other (comment) two separate placentas with normal exam after the procedure     Newborn Measurements:  Birth Weight: 2.74 kg    Birth Length: 48.2 cm    Head Circumference: 33.5 cm    Chest Circumference: 30.5 cm    Abdominal Girth:      Other Providers:   Lorin Picket Aimar Borghi  Arletha Grippe M Riddle  John B Whitney Iv  Mark A Klapperich  Lendon Collar Catto  Janine Ores  Jeanice Lim  Rosaland Lao Obstetrician  Primary Nurse  Nursery Nurse  Anesthesiologist  CRNA  Nurse Practitioner  Midwife  Nurse Practitioner  Respiratory Therapist  [facility]         Information for the patient's newborn:  KASUMI, DITULLIO [[id number]]       Labor Events:   Preterm Labor: Yes   Rupture Date: 10/02/2013   Rupture Time: 9:18 PM   Rupture Type: AROM   Amniotic Fluid Volume: Large    Amniotic Fluid Description: Clear    Induction: None       Augmentation: None   Labor  Events:       Cervical Ripening:   None     Delivery Events:  Estimated Blood Loss (ml): 800ml     Delivery Date: 10/02/2013    Delivery Time: 9:18 PM  Delivery Type: Low Transverse C-Section   Cesarean Section Delivery:          Incidence: Primary   Scheduled: Unscheduled     Sex:  Female    Gestational Age: 10.4 weeks.  Delivery Clinician:  Lorin Pickethomas M Karrine Kluttz  Living Status: Yes  Delivery Location: OR            APGARS  One minute Five minutes Ten minutes   Skin Color: 0   0   2     Heart Rate: 2   2   2      Reflex  Irritability: 2   2   2      Muscle Tone: 1   2   2      Respiration: 1   2   2      Total: 6  8  10       Presentation: Transverse    Position: Right Sacrum Anterior  Resuscitation Method:  Tactile Stimulation  Suctioning-bulb  PPV     Meconium Stained: None      Cord Information: 3 Vessels      Cord Events: None      Cord Blood Sent?:  Yes    Blood Gases Sent?:  Yes    Placenta:  Date: 10/02/2013   Time: 9:20 PM  Removal: Expressed    Appearance: Normal  Intact  Other (comment) two separate placentas     Newborn Measurements:  Birth Weight: 2.58 kg    Birth Length: 46 cm    Head Circumference: 34 cm    Chest Circumference: 32 cm    Abdominal Girth:      Other Providers:   Lorin Pickethomas M Natanael Saladin  Arletha GrippeJessica C Smith  Lisa M Riddle  John B Whitney Iv  Mark A Klapperich  Lendon CollarSusan E Collins  Laura B Dobbs  Elaine S Catto  Jeanne IvanSusan A Bradley  Lacinda G Snow  Helen M Smith Obstetrician  Primary Nurse  Primary Newborn Nurse  Anesthesiologist  CRNA  Nurse Practitioner  Midwife  Nurse Practitioner  Tech  Tech  Respiratory Therapist               Group B Strep:   No results found for this basename: OBExtGrBS     Information for the patient's newborn:  Yero, Baby Boy A [[id number]][[id number]]     No results found for this basename: PCTABR, PCTDIG, BILI   Information for the patient's newborn:  Adella HareEDWARDS, Baby Boy B [[id number]][[id number]]     No results found for this basename: PCTABR, PCTDIG, BILI     Lab Results   Component Value Date/Time    APH 7.303 10/02/2013  9:18 PM    APCO2 55* 10/02/2013  9:18 PM    APO2 <36* 10/02/2013  9:18 PM    AHCO3 27* 10/02/2013  9:18 PM    ABDC 0.8 10/02/2013  9:18 PM    SITE CORD 10/02/2013  9:18 PM    RSCOM n a at 1 19 2015 10 14 44 PM. Not read back. 10/02/2013  9:18 PM

## 2013-09-22 LAB — AMB POC URINALYSIS DIP STICK MANUAL W/ MICRO: Glucose (UA POC): NEGATIVE

## 2013-09-22 NOTE — Patient Instructions (Signed)
Weeks 34 to 36 of Your Pregnancy: After Your Visit  Your Care Instructions     By now, your baby and your belly have grown quite large. It is almost time to give birth. Your baby could be born at any time between 37 and 42 weeks. His or her lungs are almost ready to breathe air. The bones in your baby's head are now firm enough to protect it, but soft enough to move down through the birth canal.  You may feel excited, happy, anxious, or scared. You may wonder how you will know if you are in labor or what to expect during labor. Try to be flexible in your expectations of the birth. Because each birth is different, there is no way to know exactly what childbirth will be like for you. This care sheet will help you know what to expect and how to prepare. This may make your childbirth easier.  If you haven't already had the Tdap shot during this pregnancy, talk to your doctor about getting it. It will help protect your newborn against pertussis infection.  Follow-up care is a key part of your treatment and safety. Be sure to make and go to all appointments, and call your doctor if you are having problems. It's also a good idea to know your test results and keep a list of the medicines you take.  How can you care for yourself at home?  Learn about pain relief choices  ?? Pain is different for every woman. Talk with your doctor about your feelings about pain.  ?? If you choose a natural birth and would like to use few or no painkillers, know what pain medicines can be given through a shot or IV.  ?? If you choose to dull the pain as labor starts, you could have a spinal or an epidural. With these medicines, you will be alert, but you will be numb from the chest down.  ?? Be sure to tell your doctor about your pain medicine choice before you start labor or very early in your labor. You may be able to change your mind as labor progresses.  ?? Rarely, a woman is put to sleep by medicine given through a mask or an IV.  Labor and  delivery  ?? The first stage of labor has three parts: early, active, and transition.  ?? Most women have early labor at home. You can stay busy or rest, eat light snacks, drink clear fluids, and start counting contractions.  ?? When talking during a contraction gets hard, you may be moving to active labor. During active labor, you should head for the hospital if you are not there already.  ?? You are in active labor when contractions come every 3 to 4 minutes and last about 60 seconds. Your cervix is opening more rapidly.  ?? If your water breaks, contractions will come faster and stronger.  ?? During transition, your cervix is stretching, and contractions are coming more rapidly.  ?? You may want to push, but your cervix might not be ready. Your doctor will tell you when to push.  ?? The second stage starts when your cervix is completely opened and you are ready to push.  ?? Contractions are very strong to push the baby down the birth canal.  ?? You will feel the urge to push. You may feel like you need to have a bowel movement.  ?? You may be coached to push with contractions. These contractions will be very   strong, but you will not have them as often. You can get a little rest between contractions.  ?? You may be emotional and irritable. You may not be aware of what is going on around you.  ?? One last push, and your baby is born.  ?? The third stage is when a few more contractions push out the placenta. This may take 30 minutes or less.  ?? The fourth stage is the welcome recovery. You may feel overwhelmed with emotions and exhausted but alert. This is a good time to start breast-feeding.   Where can you learn more?   Go to http://www.healthwise.net/[facility]  Enter B912 in the search box to learn more about "Weeks 34 to 36 of Your Pregnancy: After Your Visit."   ?? 2006-2014 Healthwise, Incorporated. Care instructions adapted under license by [employer] (which disclaims liability or warranty for this information). This care  instruction is for use with your licensed healthcare professional. If you have questions about a medical condition or this instruction, always ask your healthcare professional. Healthwise, Incorporated disclaims any warranty or liability for your use of this information.  Content Version: 10.2.346038; Current as of: February 15, 2013

## 2013-09-22 NOTE — Progress Notes (Signed)
BPP 8/8 for both twins today. No OB complaints except pressure which is unchanged. Reviewed indication for PLTCD if fetus A remains breech. All questions answered. Per MFM recommendations, will have weekly testing with BPPs in our office and induce/deliver at 38 weeks if undelivered (10/20/13). Return in 1 week for appt & BPP. Labor precautions given.

## 2013-09-28 LAB — AMB POC URINALYSIS DIP STICK MANUAL W/ MICRO
Glucose (UA POC): NEGATIVE
Protein (UA POC): NEGATIVE mg/dL

## 2013-09-28 NOTE — Patient Instructions (Signed)
Weeks 34 to 36 of Your Pregnancy: After Your Visit  Your Care Instructions     By now, your baby and your belly have grown quite large. It is almost time to give birth. Your baby could be born at any time between 37 and 42 weeks. His or her lungs are almost ready to breathe air. The bones in your baby's head are now firm enough to protect it, but soft enough to move down through the birth canal.  You may feel excited, happy, anxious, or scared. You may wonder how you will know if you are in labor or what to expect during labor. Try to be flexible in your expectations of the birth. Because each birth is different, there is no way to know exactly what childbirth will be like for you. This care sheet will help you know what to expect and how to prepare. This may make your childbirth easier.  If you haven't already had the Tdap shot during this pregnancy, talk to your doctor about getting it. It will help protect your newborn against pertussis infection.  Follow-up care is a key part of your treatment and safety. Be sure to make and go to all appointments, and call your doctor if you are having problems. It's also a good idea to know your test results and keep a list of the medicines you take.  How can you care for yourself at home?  Learn about pain relief choices  ?? Pain is different for every woman. Talk with your doctor about your feelings about pain.  ?? If you choose a natural birth and would like to use few or no painkillers, know what pain medicines can be given through a shot or IV.  ?? If you choose to dull the pain as labor starts, you could have a spinal or an epidural. With these medicines, you will be alert, but you will be numb from the chest down.  ?? Be sure to tell your doctor about your pain medicine choice before you start labor or very early in your labor. You may be able to change your mind as labor progresses.  ?? Rarely, a woman is put to sleep by medicine given through a mask or an IV.  Labor and  delivery  ?? The first stage of labor has three parts: early, active, and transition.  ?? Most women have early labor at home. You can stay busy or rest, eat light snacks, drink clear fluids, and start counting contractions.  ?? When talking during a contraction gets hard, you may be moving to active labor. During active labor, you should head for the hospital if you are not there already.  ?? You are in active labor when contractions come every 3 to 4 minutes and last about 60 seconds. Your cervix is opening more rapidly.  ?? If your water breaks, contractions will come faster and stronger.  ?? During transition, your cervix is stretching, and contractions are coming more rapidly.  ?? You may want to push, but your cervix might not be ready. Your doctor will tell you when to push.  ?? The second stage starts when your cervix is completely opened and you are ready to push.  ?? Contractions are very strong to push the baby down the birth canal.  ?? You will feel the urge to push. You may feel like you need to have a bowel movement.  ?? You may be coached to push with contractions. These contractions will be very   strong, but you will not have them as often. You can get a little rest between contractions.  ?? You may be emotional and irritable. You may not be aware of what is going on around you.  ?? One last push, and your baby is born.  ?? The third stage is when a few more contractions push out the placenta. This may take 30 minutes or less.  ?? The fourth stage is the welcome recovery. You may feel overwhelmed with emotions and exhausted but alert. This is a good time to start breast-feeding.   Where can you learn more?   Go to http://www.healthwise.net/[facility]  Enter B912 in the search box to learn more about "Weeks 34 to 36 of Your Pregnancy: After Your Visit."   ?? 2006-2014 Healthwise, Incorporated. Care instructions adapted under license by [employer] (which disclaims liability or warranty for this information). This care  instruction is for use with your licensed healthcare professional. If you have questions about a medical condition or this instruction, always ask your healthcare professional. Healthwise, Incorporated disclaims any warranty or liability for your use of this information.  Content Version: 10.2.346038; Current as of: February 15, 2013

## 2013-09-28 NOTE — Progress Notes (Signed)
See PN flowsheet. BPP 8/8 x both twins. Complains of vaginal pressure and occasional pains in her uterus that she believes are from fetal movements. SVE 1/80/-5 today, very posterior. GBS collected. Reviewed MFM recs for PLTCD at 38 weeks if undelivered. Patient does not want to have her baby on 10/20/13 and would prefer to have it on either the 5th, 7th, 8th or 9th. Discussed that elective procedures are not usually scheduled on the weekend and she would only be 37 6/7 on 10/19/13 so I will discuss this with the MFMs and get recommendation for 10/19/13 versus 10/23/13. Return in 1 week for growth ultrasound and BPP with OB check.

## 2013-10-01 DIAGNOSIS — O329XX0 Maternal care for malpresentation of fetus, unspecified, not applicable or unspecified: Secondary | ICD-10-CM

## 2013-10-01 DIAGNOSIS — O3090 Multiple gestation, unspecified, unspecified trimester: Secondary | ICD-10-CM

## 2013-10-01 NOTE — Progress Notes (Signed)
Pt has arrived to triage with complaints of abdominal pain, back pain, and pressure that started at 2030 and has increased since then. Plan of care discussed with pt on assessing for labor and understanding was verbalized. Pt denies any bleeding or ruptured membranes. Pt reports good fetal movement. Urine specimen obtained and pt placed on EFM.

## 2013-10-01 NOTE — Progress Notes (Signed)
Dr. Tacey Ruizoesch called with report that pt is contracting every 1-2 mins for 30-60 secs, pain level is 9, SVE 1/50/-3, urine was negative for ketones, proteins, and glucose, VS WNL see Doc Flowsheet, pt pregnant with twins and both are reactive. Orders received to admit for observation. See admit orders and MAR.

## 2013-10-02 ENCOUNTER — Inpatient Hospital Stay
Admit: 2013-10-02 | Discharge: 2013-10-06 | Disposition: A | Discharge status: Home / Self Care | Payer: BLUE CROSS/BLUE SHIELD | Source: Ambulatory Visit | Attending: Specialist | Admitting: Specialist

## 2013-10-02 LAB — URINALYSIS W/ RFLX MICROSCOPIC
Bilirubin: NEGATIVE
Blood: NEGATIVE
Glucose: NEGATIVE mg/dL
Ketone: NEGATIVE mg/dL
Nitrites: NEGATIVE
Specific gravity: 1.013 (ref 1.001–1.023)
Urobilinogen: 0.2 EU/dL (ref 0.2–1.0)
pH (UA): 6 (ref 5.0–9.0)

## 2013-10-02 LAB — CBC WITH AUTOMATED DIFF
ABS. BASOPHILS: 0 10*3/uL (ref 0.0–0.2)
ABS. EOSINOPHILS: 0.1 10*3/uL (ref 0.0–0.8)
ABS. IMM. GRANS.: 0.2 10*3/uL (ref 0.0–0.5)
ABS. LYMPHOCYTES: 3.4 10*3/uL (ref 0.5–4.6)
ABS. MONOCYTES: 1.4 10*3/uL — ABNORMAL HIGH (ref 0.1–1.3)
ABS. NEUTROPHILS: 6.5 10*3/uL (ref 1.7–8.2)
BASOPHILS: 0 % (ref 0.0–2.0)
EOSINOPHILS: 1 % (ref 0.5–7.8)
HCT: 38.4 % (ref 35.8–46.3)
HGB: 12.9 g/dL (ref 11.7–15.4)
IMMATURE GRANULOCYTES: 1.8 % (ref 0.0–5.0)
LYMPHOCYTES: 30 % (ref 13–44)
MCH: 28.3 PG (ref 26.1–32.9)
MCHC: 33.6 g/dL (ref 31.4–35.0)
MCV: 84.2 FL (ref 79.6–97.8)
MONOCYTES: 12 % (ref 4.0–12.0)
MPV: 11.3 FL (ref 10.8–14.1)
NEUTROPHILS: 55 % (ref 43–78)
PLATELET: 274 10*3/uL (ref 150–450)
RBC: 4.56 M/uL (ref 4.05–5.25)
RDW: 12.8 % (ref 11.9–14.6)
WBC: 11.6 10*3/uL — ABNORMAL HIGH (ref 4.3–11.1)

## 2013-10-02 LAB — TYPE & SCREEN
ABO/Rh(D): A POS
Antibody screen: NEGATIVE

## 2013-10-02 LAB — POTASSIUM: Potassium: 4.1 mmol/L (ref 3.5–5.1)

## 2013-10-02 LAB — TYPE AND SCREEN
ABO/Rh: A POS
Antibody Screen: NEGATIVE

## 2013-10-02 MED ADMIN — dextrose 5% lactated ringers infusion: INTRAVENOUS | @ 23:00:00 | NDC 00409792909

## 2013-10-02 MED ADMIN — promethazine (PHENERGAN) injection 12.5 mg: INTRAVENOUS | @ 18:00:00 | NDC 00641092821

## 2013-10-02 MED ADMIN — nalbuphine (NUBAIN) injection 10 mg: INTRAVENOUS | @ 18:00:00 | NDC 00409146501

## 2013-10-02 MED ADMIN — terbutaline (BRETHINE) injection 0.25 mg: SUBCUTANEOUS | @ 07:00:00 | NDC 00143974601

## 2013-10-02 MED ADMIN — lactated ringers infusion: INTRAVENOUS | @ 08:00:00 | NDC 00409795309

## 2013-10-02 MED ADMIN — butorphanol (STADOL) injection 1 mg: INTRAMUSCULAR | @ 08:00:00 | NDC 00409162301

## 2013-10-02 MED ADMIN — butorphanol (STADOL) injection 1 mg: INTRAMUSCULAR | @ 15:00:00 | NDC 00409162301

## 2013-10-02 MED ADMIN — promethazine (PHENERGAN) injection 25 mg: INTRAMUSCULAR | @ 11:00:00 | NDC 00641092821

## 2013-10-02 MED ADMIN — polyethylene glycol (MIRALAX) packet 17 g: ORAL | @ 18:00:00 | NDC 51079030601

## 2013-10-02 MED ADMIN — butorphanol (STADOL) injection 1 mg: INTRAMUSCULAR | @ 11:00:00 | NDC 00409162301

## 2013-10-02 MED ADMIN — promethazine (PHENERGAN) injection 25 mg: INTRAMUSCULAR | @ 05:00:00 | NDC 00641092821

## 2013-10-02 MED ADMIN — terbutaline (BRETHINE) injection 0.25 mg: SUBCUTANEOUS | @ 05:00:00 | NDC 00143974601

## 2013-10-02 MED ADMIN — nalbuphine (NUBAIN) injection 10 mg: INTRAVENOUS | @ 23:00:00 | NDC 00409146501

## 2013-10-02 MED FILL — MIRALAX 17 GRAM ORAL POWDER PACKET: 17 gram | ORAL | Qty: 1

## 2013-10-02 MED FILL — BUTORPHANOL TARTRATE 1 MG/ML INJECTION: 1 mg/mL | INTRAMUSCULAR | Qty: 1

## 2013-10-02 MED FILL — PROMETHAZINE 25 MG/ML INJECTION: 25 mg/mL | INTRAMUSCULAR | Qty: 1

## 2013-10-02 MED FILL — NALBUPHINE 20 MG/ML INJECTION: 20 mg/mL | INTRAMUSCULAR | Qty: 1

## 2013-10-02 MED FILL — ENEMA DISPOSABLE 19 GRAM-7 GRAM/118 ML: 19-7 gram/118 mL | RECTAL | Qty: 133

## 2013-10-02 MED FILL — LACTATED RINGERS IV: INTRAVENOUS | Qty: 1000

## 2013-10-02 MED FILL — TERBUTALINE 1 MG/ML SUB-Q: 1 mg/mL | SUBCUTANEOUS | Qty: 1

## 2013-10-02 NOTE — Progress Notes (Signed)
Report given to L.Holden RN by SBAR and care relinquished.

## 2013-10-02 NOTE — Progress Notes (Signed)
Dr Whitney at bs

## 2013-10-02 NOTE — Progress Notes (Signed)
Pt to OR via w/c

## 2013-10-02 NOTE — Progress Notes (Signed)
To ultrasound by WC  For BBP.  Pain decreased to 4 on scale 1-10

## 2013-10-02 NOTE — Progress Notes (Signed)
Pt seen and examined after discussion with Melodye PedLaura Dobbs NP and Dr. Pennelope BrackenGreig (MFM). I counseled the patient on indication for Cesarean, namely progress into active labor based on cervical change, and reassuring fetal heart rate tracings with spontaneous negative CST indicating well being of both twins despite abnormal BPP. Decreased breathing and movement of each twin is also consistent with active labor.  I counseled Ashley Pearson on the indications, procedure, and risks of Cesarean including hemorrhage, infection, organ trauma, hysterectomy, and she verbalized good understanding.

## 2013-10-02 NOTE — Progress Notes (Addendum)
Labor Progress Note  Patient seen, fetal heart rate and contraction pattern evaluated, patient examined.  Patient Vitals for the past 1 hrs:   BP Temp Pulse Resp   10/02/13 1954 129/74 mmHg 99.5 ??F (37.5 ??C) 111 16       Physical Exam:  Cervical Exam:  3 cm dilated    80% effaced    -3 station    Presenting Part: breech   Cervical Position: posterior  Consistency: Soft  Membranes:  Intact  Uterine Activity: Frequency: Every 1.5-3 minutes minutes, Duration: 60-80 seconds and Intensity: moderate  Fetal Heart Rate: Baby A a(160) and Baby B (155) both have moderate variability.  Cardiac: normal rhythm, tachycardic, S1 and S2 present without murmur, 2+ peripheral pulses, trace edema  Respiratory: bilateral lung sounds clear to auscultation, normal effort and rate  Abdomen: firm, gravid, non-tender    Assessment/Plan:  Proceed with Cesarean Section for labor and breech fetal lie with twins. BPP reported as 2/8 on both babies although reassuring FHR tracing at the time. AFI not reported. Report not finalized. Reported to supervising physician, Dr. Tacey Ruizoesch and I came to evaluate pt. Pain 4/10, frequent contractions, low grade temperature. No uterine tenderness or other signs of infection. Dr. Tacey Ruizoesch contacted Dr. Pennelope BrackenGreig to review BPPs. In the mean time, I check Mackenzee's cervix and found her to be progressing. Notified Dr. Tacey Ruizoesch at that time and C/S called. He is in route to hospital and patient is being prepped for surgery. Risks of bleeding, infection, hysterectomy, etc explained to patient By Dr. Tacey Ruizoesch upon his arrival. They understand the situation and consent to the cesarean delivery. Pt is GBS positive (verified with office records), Ancef already ordered for cesarean. Position confirmed by Dr. Tacey Ruizoesch with ultrasound. Baby A breech.

## 2013-10-02 NOTE — Progress Notes (Signed)
Dr Tacey Ruizoesch at bs

## 2013-10-02 NOTE — Progress Notes (Signed)
Dr Tacey Ruizoesch called and given report of pt discomfort  And c/o constipation, orders obtained

## 2013-10-02 NOTE — Progress Notes (Addendum)
L.Dobbs,CNM at bs.  Reviewing strip

## 2013-10-02 NOTE — Progress Notes (Signed)
In OR.   FHT Baby A 155 pre and post spinal  FHT Baby B 140 pre and post spinal

## 2013-10-02 NOTE — Progress Notes (Signed)
Report received from L.Holden,RN.  Care of pt assumed

## 2013-10-02 NOTE — Anesthesia Procedure Notes (Signed)
Spinal Block    Start time: 10/02/2013 8:41 PM  End time: 10/02/2013 8:53 PM  Reason for block: procedure for pain and primary anesthetic  Staffing  Anesthesiologist: WHITNEY IV, JOHN B  Performed by: anesthesiologist   Prep  Risks and benefits discussed with the patient and plans are to proceed   Site marked, Timeout performed, 20:41  Patient was placed in seated position  The back was prepped at the lumbar region  Prep Solution(s): chlorhexidine  Spinal  Spinal Location: L3-4  Needle: 25G Pencan  Attempts: needle passed 2 time(s)  CSF confirmed, no blood with aspiration and no paresthesia  Assessment  Insertion was uncomplicated and patient tolerated without any apparent complications

## 2013-10-02 NOTE — Progress Notes (Addendum)
Verbal report to Benita StabileLois Holden from radiology with BPP 2/8 (?) of each twin; current tracings are reactive without decelerations and baselines 160s. Extended fetal monitoring all day today of each twin has been reactive and reassuring.   Plan: extended 90 min monitoring, re-evaluation of fetal well-being and dilation, NPO, and consideration for delivery tonight. Care plan discussed with L Dobbs CNM.

## 2013-10-02 NOTE — Op Note (Signed)
Cesarean Section Delivery Procedure Note         Name: Ashley Pearson      Medical Record Number: [id number]      Date of Birth: 02-Jun-1986     Today's Date: October 02, 2013      Preoperative Diagnosis:   IUP @ 35.3 weeks twin pregnancy    Postoperative Diagnosis:   Same with delivery of viable males  Patient Active Problem List   Diagnosis Code   ??? Uterine fibroid in pregnancy 654.10   ??? 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 Short cervix in third trimester, antepartum 649.73   ??? Dichorionic diamniotic twin gestation V91.03   ??? Constipation in pregnancy in third trimester 648.93, 564.00   ??? Preterm labor third trimester with preterm delivery third trimester 644.21       Procedure: Low Cervical Transverse Procedure(s):  CESAREAN SECTION    Surgeon(s):  Lauris Chroman, MD    Assistant:  Allene Dillon, NP    Anesthesia: Spinal (Dr. Loree Fee)    Prophylactic Antibiotics: Ancef 2g    IVF 1500 mL  EBL 800 mL  UOP 300 mL, clear  Drains Foley BSD         Fetal Description: multiple gestation, 2 males    Birth Information: Information for the patient's newborn:  KRISTELL, WOODING Boy A [[id number]]   One Minute Apgar: 4 (Filed from Delivery Summary)  Five  Minute Apgar: 9 (Filed from Delivery Summary)  Information for the patient's newborn:  JALEIGHA, DEANE [[id number]]   One Minute Apgar: 6 (Filed from Delivery Summary)  Five  Minute Apgar: 8 (Filed from Delivery Summary)      Umbilical Cord: 3 vessels present, Cord blood sent to lab for type, Rh, and Coombs' test and second twin with cord entanglement around right leg    Placenta:  expressed    Specimens: ID Type Source Tests Collected by Time Destination   1 : Uterine Fibroid Fresh Uterine  Lauris Chroman, MD 10/02/2013 2152 Pathology   1 : Twin A Placenta   Lauris Chroman, MD 10/02/2013 2201 Discarded   2 : Twin B Placenta   Lauris Chroman, MD 10/02/2013 2214 Discarded               Complications:  none    Findings: Viable twin female infants, "A" as frank breech from LST  position; and "B" delivered as a double-footling breech right sacrum anterior after internal podalic version from transverse back-up lie with the head on the left. Low segment was well-developed without extension of the hysterotomy. Cervix internal os was 3 cm dilated. A flattened 1 cm pedunculated fibroid of the anterior uterine body was excised. Both adnexae were free and normal. Two-layer hysterotomy closure was covered with Interceed.    Procedure Details:    The patient was taken to the operating room, where spinal anesthesia was administered and found to be adequate. Foley catheter had been placed using sterile technique. Fetal heart tones were reassuring prior to and following placement of anesthesia. The patient was prepped and draped in the normal sterile fashion. Time out was completed.   The abdomen was entered using the Pfannenstiel technique. The peritoneum was entered sharply well superior to the bladder. The bladder blade was then inserted. The vesicouterine and peritoneum was identified and entered sharply with Metzenbaum scissors. The bladder flap was then created bluntly and the bladder blade was reinserted. A low transverse uterine incision was made with the scalpel  and extended laterally with cephalo-caudad traction. Amniotomy was performed and the fluid of twin A was large amount clear.  The frank breech was identified as on bedside sono prior to surgery, and delivered in an atraumatic fashion with the standard maneuvers.and with gentle fundal pressure. The nose and mouth were suctioned. The newborn displayed normal cry and movement of all extremities on the operative field. The cord was clamped and cut and the newborn was handed off to the waiting neonatal care unit staff. The sac of twin B presented at the hysterotomy and internal podalic version was performed after identifying and grasping the fetal feet. Amniotomy yielded a moderate amount of clear fluid.The second twin was delivered as a  double footling breech with the usual maneuvers in a nontraumatic fashion, and with gentle fundal pressure. The nose and mouth were suctioned. The newborn displayed normal cry and movement of all extremities on the operative field. The cord was clamped and cut and the newborn was handed off to the waiting neonatal care unit staff.  Placenta was then expressed from the uterus with attention to aftercoming membranes. The uterus was exteriorized, wrapped in a moist laparotomy square, and sponge curettaged. Hysterotomy edges were secured with Allis clamps. The uterine incision was reapproximated in two layers with #1 Monocryl, using an initial running locked followed by an imbricating closure, with good hemostasis. The bladder flap was left open.   The posterior cul-de-sac was irrigated with warm normal saline. Uterine and adnexal anatomy was noted.  Good hemostasis was again reassured and the uterus was returned to the abdomen. The paracolic gutters were irrigated with warm normal saline and good hemostasis was again reassured throughout. Hysterotomy was covered with Interceed The midline and peritoneum were reapproximated with running #0 Monocryl. The fascia was closed with 0 PDS in a running fashion. Good hemostasis was assured. The subcuticular layers were thoroughly irrigated and hemostasis obtained with pinpoint cautery. The skin was closed with a 4-0 Monocryl in a subcuticular fashion. The incision was cleansed, dried, and a pressure dressing of Telfa and sponges was applied .  Fundal massage and bimanual exam revealed normal lochia without palpable or expressed clots..     The patient tolerated the procedure well. Sponge, lap, and needle counts were correct times three according to the operating room personnel.  The patient and her newborn were transported to MIU, all doing well.    Signed: Lauris Chroman, MD      October 02, 2013

## 2013-10-02 NOTE — Progress Notes (Signed)
Dr. Tacey Ruizoesch called with pt update. Reported to physician that pt reports her pain level has decreased from a 9 to a 5. Pt stated that she feels much better but still has a feeling of constipation. She did report for the last week she has been having only small hard stools. Reported that she still has contractions every 3-4 mins at this time and has had 2 doses of brethine. Discussed with physician about giving pt the 3rd dose of brethine. Reported that pt has tolerated the last 2 doses and pt's pulse has not gone over 104. Both babies are reactive. Physician ordered to give the 3rd dose of brethine. Will also change her stadol orders to every 3 hrs. See MAR.

## 2013-10-02 NOTE — Progress Notes (Signed)
Performing bedside ultrasound

## 2013-10-02 NOTE — Progress Notes (Signed)
Fleets enema given as ordered

## 2013-10-02 NOTE — Progress Notes (Addendum)
L.Dobbs,CNM at bs.  Up to bathroom  SVE 2-3/80

## 2013-10-02 NOTE — Progress Notes (Signed)
Stadol 1 mg for left side pain 5 on scale of 1-10

## 2013-10-02 NOTE — Progress Notes (Addendum)
Pt in room 453.  Recovery started.  Denies any pain at this time.  Tolerating po fluids well

## 2013-10-02 NOTE — Progress Notes (Signed)
Pericare and foley care performed.  OB pad changed

## 2013-10-02 NOTE — Other (Signed)
SBAR IN Report: Mother    Verbal report received from Jessica Smith, RN (full name & credentials) on this patient, who is now being transferred from L&D (unit) for routine progression of care.  The patient is wearing a green "Anesthesia-Duramorph" band.    Report consisted of patient's Situation, Background, Assessment and Recommendations (SBAR).       Newborn ID bands were compared with the identification form, and verified with the patient and transferring nurse.    Information from the SBAR and the [facility] Report was reviewed with the transferring nurse; opportunity for questions and clarification provided.

## 2013-10-02 NOTE — Anesthesia Post-Procedure Evaluation (Signed)
Post-Anesthesia Evaluation and Assessment    Patient: Ashley Pearson MRN: [id number]  SSN: [id number]    Date of Birth: Mar 08, 1986  Age: 28 y.o.  Sex: female       Cardiovascular Function/Vital Signs  Visit Vitals   Item Reading   ??? BP 144/75   ??? Pulse 94   ??? Temp 37.1 ??C (98.7 ??F)   ??? Resp 18   ??? Ht 5\' 8"  (1.727 m)   ??? Wt 93.895 kg (207 lb)   ??? BMI 31.48 kg/m2   ??? SpO2 99%   ??? Breastfeeding Yes       Patient is status post spinal anesthesia for Procedure(s):  CESAREAN SECTION.    Nausea/Vomiting: None    Postoperative hydration reviewed and adequate.    Pain:  Pain Scale 1: Visual (10/03/13 0429)  Pain Intensity 1: 0 (10/03/13 0429)   Managed    Neurological Status:   Neuro (WDL): Exceptions to WDL (10/02/13 2230)  Neuro  Neurologic State: Alert (10/02/13 2330)  Orientation Level: Oriented X4 (10/02/13 2330)  Cognition: Appropriate decision making;Appropriate for age attention/concentration;Appropriate safety awareness (10/02/13 2330)  Speech: Clear (10/02/13 2330)  LUE Motor Response: Purposeful (10/02/13 2330)  LLE Motor Response: Numbness;Pharmacologically paralyzed (10/02/13 2330)  RUE Motor Response: Purposeful (10/02/13 2330)  RLE Motor Response: Numbness;Pharmacologically paralyzed (10/02/13 2330)   Block resolving    Mental Status and Level of Consciousness: Alert and oriented     Pulmonary Status:   O2 Device: Room air (10/03/13 0330)   Adequate oxygenation and airway patent    Complications related to anesthesia: None    Post-anesthesia assessment completed. No concerns    Signed By: Neta MendsJOHN B WHITNEY IV, MD     October 03, 2013

## 2013-10-02 NOTE — Progress Notes (Signed)
C/S called.  Team notified

## 2013-10-02 NOTE — Progress Notes (Signed)
Dr Tacey Ruizoesch here to assess pt

## 2013-10-02 NOTE — Progress Notes (Signed)
Report from Sarah Walden RN

## 2013-10-02 NOTE — Anesthesia Pre-Procedure Evaluation (Signed)
Anesthetic History   No history of anesthetic complications           Review of Systems / Medical History  Patient summary reviewed and pertinent labs reviewed    Pulmonary  Within defined limits               Neuro/Psych   Within defined limits           Cardiovascular                Exercise tolerance: >4 METS     GI/Hepatic/Renal     GERD: poorly controlled             Endo/Other             Other Findings   Comments: Intrauterine pregnancy, twin gestation         Physical Exam    Airway  Mallampati: III  TM Distance: 4 - 6 cm  Neck ROM: normal range of motion   Mouth opening: Normal     Cardiovascular    Rhythm: regular  Rate: normal         Dental  No notable dental hx       Pulmonary  Breath sounds clear to auscultation               Abdominal         Other Findings            Anesthetic Plan    ASA: 2  Anesthesia type: spinal      Post-op pain plan if not by surgeon: intrathecal opiates      Anesthetic plan and risks discussed with: Patient

## 2013-10-02 NOTE — H&P (Signed)
History & Physical    Name: Ashley Pearson MRN: [id number]  SSN: [id number]    Date of Birth: 09-23-1985  Age: 28 y.o.  Sex: female      Subjective:     Reason for Admission:  Pregnancy and abdominal pain , possibly contractions    History of Present Illness: Ms. Ashley Pearson is a 28 y.o. African American female with an estimated gestational age of 6949w3d with Estimated Date of Delivery: 11/03/13. Patient complains of severe abdominal pain, mild nausea/vomiting, moderate pelvic pressure, normal fetal movement and low back and hip pain aggravated by movement for 1 day since about 2030 hours yesterday; at the onset she was out shopping, and recalls nausea and reduced oral intake most of the day. She has not experienced similar symptoms prior to this. Pregnancy has been complicated by twins. Patient denies chest pain, fever, shortness of breath, swelling, vaginal bleeding , vaginal leaking of fluid , visual disturbances and dysuria.    OB History    Grav Para Term Preterm Abortions TAB SAB Ect Mult Living    1                # Outc Date GA Lbr Len/2nd Wgt Sex Del Anes PTL Lv    1 CUR                 Past Medical History   Diagnosis Date   ??? Headache    ??? UTI (urinary tract infection)    ??? Abnormal Pap smear      colposcopy, then normal   ??? Constipation, chronic    ??? 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 Abdominal pain in pregnancy, antepartum 10/02/2013   ??? Constipation in pregnancy in third trimester 10/02/2013     Past Surgical History   Procedure Laterality Date   ??? Hx tonsil and adenoidectomy  1997   ??? Hx wisdom teeth extraction  2007   ??? Hx colonoscopy       high school     Social History     Occupational History   ??? Not on file.     Social History Main Topics   ??? Smoking status: Never Smoker    ??? Smokeless tobacco: Never Used   ??? Alcohol Use: No   ??? Drug Use: No   ??? Sexually Active: Yes -- Female  partner(s)     Birth Control/ Protection: Condom      Family History   Problem Relation Age of Onset   ??? Breast Cancer Mother    ??? Heart Disease Mother    ??? Migraines Mother    ??? Hypertension Mother    ??? Diabetes Father    ??? Asthma Brother    ??? Ovarian Cancer Paternal Grandmother        No Known Allergies  Prior to Admission medications    Medication Sig Start Date End Date Taking? Authorizing Provider   cholecalciferol, vitamin D3, (VITAMIN D3) 2,000 unit tab Take  by mouth.    Historical Provider   folic acid 800 mcg tablet Take 800 mcg by mouth daily.    Historical Provider   iron-FA#6-C-B12-biot-coppr-DSS (FERIVAFA) 110 mg-1 mg -175 mg-12 mcg cap Take 1 Tab by mouth daily. 04/24/13   Richardson LandryLaura B Dobbs, NP   PNV no.24-iron-folic acid-dha (PRENATAL DHA+COMPLETE PRENATAL) [phone] mg-mcg-mg  cmpk Take  by mouth.    Historical Provider   valACYclovir (VALTREX) 500 mg tablet Take 1 Tab by mouth two (2) times a day. See handout by provider for specific dosing 03/26/13   Richardson Landry, NP        Review of Systems:  Pertinent items are noted in the History of Present Illness.     Objective:     Vitals:    Filed Vitals:    10/02/13 0218 10/02/13 0530 10/02/13 0730 10/02/13 0945   BP:  119/68 120/68 135/86   Pulse:  110 106 96   Temp:   99.1 ??F (37.3 ??C) 98.8 ??F (37.1 ??C)   Resp:  20 18    Height: 5\' 8"  (1.727 m)      Weight: 93.895 kg (207 lb)      SpO2:          Temp (24hrs), Avg:98.7 ??F (37.1 ??C), Min:98.3 ??F (36.8 ??C), Max:99.1 ??F (37.3 ??C)    BP  Min: 119/68  Max: 135/86     Physical Exam:  Patient with distress, with slowed, laborious turning and respositioning in bed.  Heart: Regular rate and rhythm, S1S2 present or without murmur or extra heart sounds  Lung: clear to auscultation throughout lung fields, no wheezes, no rales, no rhonchi and normal respiratory effort  Back: costovertebral angle tenderness absent and moderate SI-area tenderness to palpation  Abdomen: protuberant, distended, tenderness in the right lower  quadrant - mild and in the left lower quadrant - mild  Fundus: non tender  Perineum: blood absent, amniotic fluid absent  Cervical Exam: 1/50 %/-3/Posterior with palpable stool in the rectum and presenting twin non-vertex (transverse by history)  Lower Extremities:  - Edema 1+  DTR2+ clonus absent    Membranes:  Intact  Uterine Activity:  Irritability, but less defined pattern than on admission  Fetal Heart Rate:  Reactive  Uterine irritability: yes  Both twins monitoring well       Lab/Data Review:  Recent Results (from the past 24 hour(s))   CBC WITH AUTOMATED DIFF    Collection Time     10/01/13 11:20 PM       Result Value Range    WBC 11.6 (*) 4.3 - 11.1 K/uL    RBC 4.56  4.05 - 5.25 M/uL    HGB 12.9  11.7 - 15.4 g/dL    HCT 45.4  09.8 - 11.9 %    MCV 84.2  79.6 - 97.8 FL    MCH 28.3  26.1 - 32.9 PG    MCHC 33.6  31.4 - 35.0 g/dL    RDW 14.7  82.9 - 56.2 %    PLATELET 274  150 - 450 K/uL    MPV 11.3  10.8 - 14.1 FL    DF AUTOMATED      NEUTROPHILS 55  43 - 78 %    LYMPHOCYTES 30  13 - 44 %    MONOCYTES 12  4.0 - 12.0 %    EOSINOPHILS 1  0.5 - 7.8 %    BASOPHILS 0  0.0 - 2.0 %    IMMATURE GRANULOCYTES 1.8  0.0 - 5.0 %    ABS. NEUTROPHILS 6.5  1.7 - 8.2 K/UL    ABS. LYMPHOCYTES 3.4  0.5 - 4.6 K/UL    ABS. MONOCYTES 1.4 (*) 0.1 - 1.3 K/UL    ABS. EOSINOPHILS 0.1  0.0 - 0.8 K/UL    ABS. BASOPHILS 0.0  0.0 - 0.2  K/UL    ABS. IMM. GRANS. 0.2  0.0 - 0.5 K/UL   TYPE & SCREEN    Collection Time     10/01/13 11:20 PM       Result Value Range    Crossmatch Expiration 10/04/2013      ABO/Rh(D) A POSITIVE      Antibody screen NEG     POTASSIUM    Collection Time     10/01/13 11:20 PM       Result Value Range    Potassium 4.1  3.5 - 5.1 mmol/L       Assessment and Plan:     Patient Active Problem List   Diagnosis Code   ??? Uterine fibroid in pregnancy 654.10   ??? 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 Short cervix in third trimester, antepartum 649.73   ??? Dichorionic diamniotic twin gestation V91.03   ??? Abdominal pain in pregnancy, antepartum  646.83, 789.00   ??? Constipation in pregnancy in third trimester 648.93, 564.00     Case reviewed with Dr. Pennelope Bracken, MFM, and in detail with patient, FOB and mother. Dr. Pennelope Bracken will pull previous sonos and re-scan if indicated.   In the absence of cervical change, SROM, or maternal a/o fetal compromise, will plan supportive care for continuation of the pregnancy until planned EPCS 38 weeks.  Continue judicious hydration, ml UA C&S  Fleets (given) and resumption of Miralax  K-pad  Discontinue further terbutaline  Advise Nubain rather than further Stadol  Questions answered in detail.    Signed By:  Boykin Reaper, MD     October 02, 2013

## 2013-10-02 NOTE — Progress Notes (Signed)
SBAR OUT Report: Mother    Verbal report given to A.Hudson,RN on this patient, who is now being transferred to MIU for routine progression of care.  The patient is wearing a green "Anesthesia-Duramorph" band.    Report consisted of patient's Situation, Background, Assessment and Recommendations (SBAR).       Newborn ID bands were compared with the identification form, and verified with the patient and receiving nurse.    Information from the SBAR, Intake/Output, MAR and Recent Results and the [facility] Report was reviewed with the receiving nurse; opportunity for questions and clarification provided.

## 2013-10-03 LAB — RT--CORD BLOOD GAS
BASE DEFICIT,CBA: 0.8 mmol/L (ref 0.0–2.0)
BASE DEFICIT,CBA: 4.1 mmol/L — ABNORMAL HIGH (ref 0.0–2.0)
HCO3,CORD BLD ARTERIAL: 25 mmol/L (ref 22–26)
HCO3,CORD BLD ARTERIAL: 27 mmol/L — ABNORMAL HIGH (ref 22–26)
PCO2,CORD BLD ARTERIAL: 55 mmHg — ABNORMAL HIGH (ref 33–49)
PCO2,CORD BLD ARTERIAL: 65 mmHg — ABNORMAL HIGH (ref 33–49)
PH,CORD BLD ARTERIAL: 7.206 — ABNORMAL LOW (ref 7.21–7.31)
PH,CORD BLD ARTERIAL: 7.303 (ref 7.21–7.31)
PO2,CORD BLD ARTERIAL: <36 mmHg — ABNORMAL HIGH (ref 9–19)
PO2,CORD BLD ARTERIAL: <36 mmHg — ABNORMAL HIGH (ref 9–19)

## 2013-10-03 LAB — HGB & HCT
HCT: 31.1 % — ABNORMAL LOW (ref 35.8–46.3)
HGB: 10.2 g/dL — ABNORMAL LOW (ref 11.7–15.4)

## 2013-10-03 MED ORDER — SODIUM CHLORIDE 0.9 % IJ SYRG
Freq: Three times a day (TID) | INTRAMUSCULAR | Status: DC
Start: 2013-10-03 — End: 2013-10-03

## 2013-10-03 MED ORDER — LACTATED RINGERS IV
INTRAVENOUS | Status: DC
Start: 2013-10-03 — End: 2013-10-03
  Administered 2013-10-03 (×2): via INTRAVENOUS

## 2013-10-03 MED ORDER — SODIUM CHLORIDE 0.9 % IJ SYRG
INTRAMUSCULAR | Status: DC | PRN
Start: 2013-10-03 — End: 2013-10-03

## 2013-10-03 MED ORDER — LACTATED RINGERS BOLUS IV
Freq: Once | INTRAVENOUS | Status: DC
Start: 2013-10-03 — End: 2013-10-02

## 2013-10-03 MED ORDER — LACTATED RINGERS IV
INTRAVENOUS | Status: DC
Start: 2013-10-03 — End: 2013-10-02
  Administered 2013-10-03 (×2): via INTRAVENOUS

## 2013-10-03 MED ADMIN — ibuprofen (MOTRIN) tablet 800 mg: ORAL | @ 23:00:00 | NDC 68084077211

## 2013-10-03 MED ADMIN — metoclopramide HCl (REGLAN) injection 10 mg: INTRAVENOUS | @ 01:00:00 | NDC 23155024031

## 2013-10-03 MED ADMIN — ceFAZolin (ANCEF) 2g in 100 ml 0.9% NS IVPB: INTRAVENOUS | @ 02:00:00 | NDC 44567084025

## 2013-10-03 MED ADMIN — morphine (pf) (DURAMORPH;ASTRAMORPH) 0.5 mg/mL injection: INTRATHECAL | @ 03:00:00 | NDC 00409381412

## 2013-10-03 MED ADMIN — oxyCODONE IR (ROXICODONE) tablet 10 mg: ORAL | @ 05:00:00 | NDC 68308050547

## 2013-10-03 MED ADMIN — ketorolac (TORADOL) injection: INTRAVENOUS | @ 03:00:00 | NDC 00409379501

## 2013-10-03 MED ADMIN — lactated ringers infusion: INTRAVENOUS | @ 02:00:00 | NDC 11845118709

## 2013-10-03 MED ADMIN — ePHEDrine (MISTOLE) 50 mg/mL injection: INTRAVENOUS | @ 02:00:00 | NDC 17478051500

## 2013-10-03 MED ADMIN — acetaminophen (TYLENOL) tablet 1,000 mg: ORAL | @ 13:00:00 | NDC 50580045110

## 2013-10-03 MED ADMIN — oxyCODONE IR (ROXICODONE) tablet 10 mg: ORAL | @ 21:00:00 | NDC 68084035411

## 2013-10-03 MED ADMIN — sodium citrate-citric acid (BICITRA) 500-334 mg/5 mL solution 30 mL: ORAL | @ 01:00:00

## 2013-10-03 MED ADMIN — ketorolac (TORADOL) injection 30 mg: INTRAVENOUS | @ 09:00:00 | NDC 63323016201

## 2013-10-03 MED ADMIN — oxyCODONE IR (ROXICODONE) tablet 10 mg: ORAL | @ 16:00:00 | NDC 68084035411

## 2013-10-03 MED ADMIN — ondansetron (ZOFRAN) injection: INTRAVENOUS | @ 02:00:00 | NDC 00781301072

## 2013-10-03 MED ADMIN — acetaminophen (TYLENOL) tablet 1,000 mg: ORAL | @ 19:00:00 | NDC 50580045110

## 2013-10-03 MED ADMIN — bupivacaine 0.75% in dextrose 8.25% preserv-free (SENSORCAINE) 0.75 % (7.5 mg/mL) infusion: INTRATHECAL | @ 02:00:00 | NDC 00409176102

## 2013-10-03 MED ADMIN — ceFAZolin in 0.9% NS (ANCEF) IVPB soln 2 g: INTRAVENOUS | @ 01:00:00 | NDC 09999966850

## 2013-10-03 MED ADMIN — lactated ringers infusion: INTRAVENOUS | @ 02:00:00 | NDC 00409795309

## 2013-10-03 MED ADMIN — famotidine (PF) (PEPCID) injection 20 mg: INTRAVENOUS | @ 01:00:00 | NDC 00641602201

## 2013-10-03 MED ADMIN — diphenoxylate-atropine (LOMOTIL) tablet 2 tablet: ORAL | @ 13:00:00 | NDC 00378041501

## 2013-10-03 MED ADMIN — morphine (pf) (DURAMORPH;ASTRAMORPH) 0.5 mg/mL injection: EPIDURAL | @ 03:00:00 | NDC 00074405701

## 2013-10-03 MED ADMIN — morphine (pf) (DURAMORPH;ASTRAMORPH) 0.5 mg/mL injection: INTRATHECAL | @ 02:00:00 | NDC 00409381412

## 2013-10-03 MED ADMIN — carboprost (HEMABATE) injection: INTRAMUSCULAR | @ 02:00:00 | NDC 00009085608

## 2013-10-03 MED ADMIN — diphenhydrAMINE (BENADRYL) injection 12.5 mg: INTRAVENOUS | @ 13:00:00 | NDC 63323066401

## 2013-10-03 MED ADMIN — ibuprofen (MOTRIN) tablet 800 mg: ORAL | @ 17:00:00 | NDC 68084077211

## 2013-10-03 MED ADMIN — diphenoxylate-atropine (LOMOTIL) tablet 1 tablet: ORAL | @ 06:00:00 | NDC 00378041501

## 2013-10-03 MED ADMIN — diphenhydrAMINE (BENADRYL) capsule 25 mg: ORAL | @ 17:00:00 | NDC 00904530661

## 2013-10-03 MED ADMIN — oxytocin (PITOCIN) 30 units/500 ml LR: INTRAVENOUS | @ 02:00:00 | NDC 99991083550

## 2013-10-03 MED FILL — DIPHENOXYLATE-ATROPINE 2.5 MG-0.025 MG TAB: ORAL | Qty: 1

## 2013-10-03 MED FILL — ONDANSETRON (PF) 4 MG/2 ML INJECTION: 4 mg/2 mL | INTRAMUSCULAR | Qty: 2

## 2013-10-03 MED FILL — METOCLOPRAMIDE 5 MG/ML IJ SOLN: 5 mg/mL | INTRAMUSCULAR | Qty: 2

## 2013-10-03 MED FILL — LACTATED RINGERS IV: INTRAVENOUS | Qty: 1000

## 2013-10-03 MED FILL — FAMOTIDINE (PF) 20 MG/2 ML IV: 20 mg/2 mL | INTRAVENOUS | Qty: 2

## 2013-10-03 MED FILL — MARCAINE SPINAL (PF) 0.75 % (7.5 MG/ML) INJECTION SOLUTION: 0.75 % (7.5 mg/mL) | INTRAMUSCULAR | Qty: 1.8

## 2013-10-03 MED FILL — CEFAZOLIN 2 GRAM/50 ML NS IVPB: INTRAVENOUS | Qty: 50

## 2013-10-03 MED FILL — KETOROLAC TROMETHAMINE 30 MG/ML INJECTION: 30 mg/mL (1 mL) | INTRAMUSCULAR | Qty: 1

## 2013-10-03 MED FILL — MARCAINE SPINAL (PF) 0.75 % (7.5 MG/ML) INJECTION SOLUTION: 0.75 % (7.5 mg/mL) | INTRAMUSCULAR | Qty: 2

## 2013-10-03 MED FILL — MAPAP EXTRA STRENGTH 500 MG TABLET: 500 mg | ORAL | Qty: 2

## 2013-10-03 MED FILL — DIPHENHYDRAMINE HCL 50 MG/ML IJ SOLN: 50 mg/mL | INTRAMUSCULAR | Qty: 1

## 2013-10-03 MED FILL — MORPHINE (PF) 0.5 MG/ML IJ SOLN: 0.5 mg/mL | INTRAMUSCULAR | Qty: 10

## 2013-10-03 MED FILL — OXYCODONE 5 MG TAB: 5 mg | ORAL | Qty: 2

## 2013-10-03 MED FILL — SODIUM CITRATE-CITRIC ACID 500 MG-334 MG/5 ML ORAL SOLN: 500-334 mg/5 mL | ORAL | Qty: 30

## 2013-10-03 MED FILL — DEXTROSE 5%-LACTATED RINGERS IV: INTRAVENOUS | Qty: 1000

## 2013-10-03 MED FILL — IBUPROFEN 800 MG TAB: 800 mg | ORAL | Qty: 1

## 2013-10-03 MED FILL — DIPHENHYDRAMINE 25 MG CAP: 25 mg | ORAL | Qty: 1

## 2013-10-03 MED FILL — OXYTOCIN 30 UNIT IN 500 ML INFUSION: 30 unit/500 mL | INTRAVENOUS | Qty: 500

## 2013-10-03 MED FILL — DIPHENOXYLATE-ATROPINE 2.5 MG-0.025 MG TAB: ORAL | Qty: 2

## 2013-10-03 MED FILL — EPHEDRINE SULFATE 50 MG/ML IJ SOLN: 50 mg/mL | INTRAMUSCULAR | Qty: 10

## 2013-10-03 MED FILL — HEMABATE 250 MCG/ML INTRAMUSCULAR SOLUTION: 250 mcg/mL | INTRAMUSCULAR | Qty: 1

## 2013-10-03 NOTE — Progress Notes (Signed)
Report received from Michelle McGuinn, RN.

## 2013-10-03 NOTE — Progress Notes (Signed)
Requested and received Toradol 30mg SIVP for abdominal pain.

## 2013-10-03 NOTE — Progress Notes (Signed)
Admission assessment completed.

## 2013-10-03 NOTE — Progress Notes (Signed)
Patient working with lactation consultant

## 2013-10-03 NOTE — Progress Notes (Signed)
Anesthesiology C-Section Follow-up    Post-op Day 1.  S/p C-section via spinal with intraspinal narcotics for post-op pain management.  Patient is without complaints.  Pain is moderately controlled.  Moderate itching, no nausea.  No residual sensory or motor deficit.  No headache.  Site ok.  No apparent anesthetic complications.  I encouraged the patient to ask for the supplemental acetaminophen and toradol.

## 2013-10-03 NOTE — Progress Notes (Signed)
SCDs and foley removed.  Patient up to bathroom with assistance.  Peri care taught.  Patient voiced understanding.  Linen changed

## 2013-10-03 NOTE — Progress Notes (Signed)
Requested and received Lomotil for diarrhea.

## 2013-10-03 NOTE — Progress Notes (Signed)
IV infiltrated.  Dr. Sallee Langeoherty states do not restart IV and change patient to OB post op orders.

## 2013-10-03 NOTE — Progress Notes (Signed)
Shift assessment completed.

## 2013-10-03 NOTE — Progress Notes (Signed)
In to see mom and infant for first time. Mom attempted to breast feed once. Has formula fed once. Had not started pumping. Asked mom what her plans are and she stated that she wants to pump and bottle feed infants. Asked mom if now was a good time to start pumping and she said yes. Showed mom how to use pump and breast pump kit. Started mom pumping and did teaching while she pumped. Obtained 0.10m colostrum and dad fed it to "A" baby using a syringe. Showed mom how to wash pump parts. Mom plans to pump every 2-3 hours. Also plans to rent pump at discharge. Lactation consultant will follow up in am.

## 2013-10-03 NOTE — Progress Notes (Signed)
Requested and received Roxicodone 10mg for abdominal pain.

## 2013-10-03 NOTE — Progress Notes (Signed)
Report from Amy Hudson

## 2013-10-03 NOTE — Progress Notes (Signed)
Requested and received from Roxicodone 10mg  for abdominal pain.

## 2013-10-03 NOTE — Progress Notes (Signed)
Post-Operative Day Number 1 Progress Note    Patient doing well post-op day 1 from cesarean delivery without significant complaints.  Pain controlled on current medication.  Voiding without difficulty, normal lochia.    Vitals:  Patient Vitals for the past 8 hrs:   BP Temp Pulse Resp   10/03/13 1454 137/84 mmHg 98.3 ??F (36.8 ??C) 101 18   10/03/13 1053 132/77 mmHg 98.4 ??F (36.9 ??C) 85 18     Temp (24hrs), Avg:98.5 ??F (36.9 ??C), Min:97.4 ??F (36.3 ??C), Max:99.5 ??F (37.5 ??C)      Vital signs stable, afebrile.    Exam:  Patient without distress.               Abdomen soft, fundus firm at level of umbilicus, non tender.                 Incision dry and clean without erythema.               Lower extremities are negative for swelling, cords or tenderness.    Lab/Data Review:  All lab results for the last 24 hours reviewed.    Assessment and Plan:  Patient appears to be having uncomplicated post-cesarean course.  Continue routine post-op care and maternal education. Pt would like to start pumping. Dorothy from lactation aware and will assist patient in starting this process.

## 2013-10-04 MED ADMIN — simethicone (MYLICON) tablet 80 mg: ORAL | @ 21:00:00 | NDC 63739022510

## 2013-10-04 MED ADMIN — simethicone (MYLICON) tablet 80 mg: ORAL | @ 13:00:00 | NDC 63739022510

## 2013-10-04 MED ADMIN — ibuprofen (MOTRIN) tablet 800 mg: ORAL | @ 18:00:00 | NDC 68084077211

## 2013-10-04 MED ADMIN — oxyCODONE IR (ROXICODONE) tablet 10 mg: ORAL | @ 05:00:00 | NDC 68084035411

## 2013-10-04 MED ADMIN — acetaminophen (TYLENOL) tablet 1,000 mg: ORAL | @ 21:00:00 | NDC 50580045110

## 2013-10-04 MED ADMIN — oxyCODONE IR (ROXICODONE) tablet 10 mg: ORAL | @ 21:00:00 | NDC 68308050547

## 2013-10-04 MED ADMIN — oxyCODONE IR (ROXICODONE) tablet 10 mg: ORAL | @ 18:00:00 | NDC 68308050547

## 2013-10-04 MED ADMIN — ibuprofen (MOTRIN) tablet 800 mg: ORAL | @ 11:00:00 | NDC 68084077211

## 2013-10-04 MED ADMIN — oxyCODONE IR (ROXICODONE) tablet 10 mg: ORAL | @ 13:00:00 | NDC 68084035411

## 2013-10-04 MED ADMIN — docusate sodium (COLACE) capsule 100 mg: ORAL | @ 23:00:00 | NDC 62584068311

## 2013-10-04 MED ADMIN — oxyCODONE IR (ROXICODONE) tablet 10 mg: ORAL | @ 01:00:00 | NDC 68084035411

## 2013-10-04 MED ADMIN — oxyCODONE IR (ROXICODONE) tablet 10 mg: ORAL | @ 09:00:00 | NDC 00406055262

## 2013-10-04 MED ADMIN — acetaminophen (TYLENOL) tablet 1,000 mg: ORAL | @ 01:00:00 | NDC 50580045110

## 2013-10-04 MED ADMIN — ibuprofen (MOTRIN) tablet 800 mg: ORAL | @ 05:00:00 | NDC 68084077211

## 2013-10-04 MED ADMIN — acetaminophen (TYLENOL) tablet 1,000 mg: ORAL | @ 08:00:00 | NDC 00904198861

## 2013-10-04 MED ADMIN — simethicone (MYLICON) tablet 80 mg: ORAL | @ 18:00:00 | NDC 63739022510

## 2013-10-04 MED ADMIN — simethicone (MYLICON) tablet 80 mg: ORAL | @ 23:00:00 | NDC 63739022510

## 2013-10-04 MED ADMIN — docusate sodium (COLACE) capsule 100 mg: ORAL | @ 01:00:00 | NDC 62584068311

## 2013-10-04 MED ADMIN — docusate sodium (COLACE) capsule 100 mg: ORAL | @ 13:00:00 | NDC 62584068311

## 2013-10-04 MED ADMIN — simethicone (MYLICON) tablet 80 mg: ORAL | @ 01:00:00 | NDC 63739022510

## 2013-10-04 MED ADMIN — simethicone (MYLICON) tablet 80 mg: ORAL | @ 03:00:00 | NDC 94441022361

## 2013-10-04 MED ADMIN — acetaminophen (TYLENOL) tablet 1,000 mg: ORAL | @ 13:00:00 | NDC 50580045110

## 2013-10-04 MED ADMIN — ibuprofen (MOTRIN) tablet 800 mg: ORAL | @ 23:00:00 | NDC 68084077211

## 2013-10-04 MED FILL — OXYCODONE 5 MG TAB: 5 mg | ORAL | Qty: 2

## 2013-10-04 MED FILL — MAPAP EXTRA STRENGTH 500 MG TABLET: 500 mg | ORAL | Qty: 2

## 2013-10-04 MED FILL — SIMETHICONE 80 MG CHEWABLE TAB: 80 mg | ORAL | Qty: 1

## 2013-10-04 MED FILL — DOCUSATE SODIUM 100 MG CAP: 100 mg | ORAL | Qty: 1

## 2013-10-04 MED FILL — IBUPROFEN 800 MG TAB: 800 mg | ORAL | Qty: 1

## 2013-10-04 NOTE — Progress Notes (Signed)
Report received from Amy Hudson, RN; assumed pt. Care at present time. Bed-side report completed. Encouraged pt. To call with any needs. Pt. Verbalized understanding.

## 2013-10-04 NOTE — Progress Notes (Signed)
System downtime started.

## 2013-10-04 NOTE — Progress Notes (Signed)
Requested and received Roxicodone 10mg and Motrin 800mg for abdominal pain.

## 2013-10-04 NOTE — Progress Notes (Signed)
Assessment completed per flow sheet protocol; plan of care discussed with pt.; questions encouraged and answered to pt.'s satisfaction. Pt. denies any complaints at present time. Encouraged pt. to call out if any needs arise. Pt. verbalized understanding.

## 2013-10-04 NOTE — Progress Notes (Signed)
Post-Operative Day Number 2 Progress Note    Patient doing well post-op day 2 from cesarean delivery without significant complaints.  Pain controlled on current medication.  Voiding without difficulty, normal lochia.    Vitals:  No data found.    Temp (24hrs), Avg:98.4 ??F (36.9 ??C), Min:98.3 ??F (36.8 ??C), Max:98.4 ??F (36.9 ??C)      Vital signs stable, afebrile.    Exam:  Patient without distress.               Abdomen soft, fundus firm at level of umbilicus, non tender.                Incision dry and clean without erythema.               Lower extremities are negative for swelling, cords or tenderness.    Lab/Data Review:  All lab results for the last 24 hours reviewed.    Assessment and Plan:  Patient appears to be having uncomplicated post-cesarean course.  Continue routine post-op care and maternal education. Social service consult was ordered by RN with my approval. Cecil Crankerorsha has not been actively caring/feeding the twins with cues from the hospital staff. She has the support of the FOB and her mother and grandmother. I have talked with Cecil Crankerorsha about this and the need to feed on demand (at least 8 times day) to ensure that her late preterm infants remain healthy and nourished.

## 2013-10-04 NOTE — Progress Notes (Signed)
Pt. Not responding to infant's needs, wanting RN to do feed and change infant. Grandmother laying on couch not responding to crying infant. RN feed and changed both infant's, encouraged mom to feed infant's more frequently and to hold crying infant to sooth him. Pt. Verbalized understanding. This RN notified OB; order for social service received.

## 2013-10-04 NOTE — Progress Notes (Signed)
Scheduled medication -- mylicon and motrin -- given. Encouraged pt. To call with any needs. Pt. Verbalized understanding.

## 2013-10-04 NOTE — Progress Notes (Signed)
Scheduled medication --  Colace, tylenol and mylicon -- given. Encouraged pt. To call with any needs. Pt. Verbalized understanding.

## 2013-10-04 NOTE — Progress Notes (Signed)
Report received from Tammy Kluge, RN. Patient care assumed-bedside reporting completed.

## 2013-10-04 NOTE — Progress Notes (Signed)
Message left with social work regarding consult.

## 2013-10-04 NOTE — Progress Notes (Signed)
Scheduled medication -- colace and motrin -- given. Encouraged pt. To call with any needs. Pt. Verbalized understanding.

## 2013-10-04 NOTE — Progress Notes (Signed)
Questioned pt. About infant's feed; pt. Feed infants and changed diapers; encouraged pt. To pump and call with any needs. Pt. Verbalized understanding.

## 2013-10-04 NOTE — Progress Notes (Signed)
This RN questioned mom about infant's feeding; mom has not feed infants since RN feed them. This RN feed and changed infants encouraged mom to feed and change infants. Mom verbalized understanding.

## 2013-10-04 NOTE — Progress Notes (Signed)
In to check on feedings. Mom states she has been pumping and bottle feeding infants. BABY A taking bottles well, BABY B not as well. Discussed [redacted] week gestation expectations and importance of baby feeding well to ensure intake, instructed mom to call for assistance if needed from lactation consultant or RN to ensure baby feeding well via bottle. Mom states she has been pumping diligently, obtaining no colostrum yet. Reviewed hand expression and massage with pumping. Instructed mom to continue to pump at least 8 times in 24 hours. Mom plans to rent a pump for discharge home. Mom confident with pump use.

## 2013-10-04 NOTE — Progress Notes (Signed)
Up in room caring for infant at this time.  Voices no c/o's at time

## 2013-10-05 MED ADMIN — ibuprofen (MOTRIN) tablet 800 mg: ORAL | @ 19:00:00 | NDC 68084077211

## 2013-10-05 MED ADMIN — docusate sodium (COLACE) capsule 100 mg: ORAL | @ 14:00:00 | NDC 62584068311

## 2013-10-05 MED ADMIN — ibuprofen (MOTRIN) tablet 800 mg: ORAL | @ 11:00:00 | NDC 68084077211

## 2013-10-05 MED ADMIN — oxyCODONE IR (ROXICODONE) tablet 10 mg: ORAL | @ 14:00:00 | NDC 68308050547

## 2013-10-05 MED ADMIN — ibuprofen (MOTRIN) tablet 800 mg: ORAL | @ 22:00:00 | NDC 76420057790

## 2013-10-05 MED ADMIN — ibuprofen (MOTRIN) tablet 800 mg: ORAL | @ 05:00:00 | NDC 68084077211

## 2013-10-05 MED ADMIN — acetaminophen (TYLENOL) tablet 1,000 mg: ORAL | @ 14:00:00 | NDC 50580045110

## 2013-10-05 MED ADMIN — acetaminophen (TYLENOL) tablet 1,000 mg: ORAL | @ 09:00:00 | NDC 50580045110

## 2013-10-05 MED ADMIN — docusate sodium (COLACE) capsule 100 mg: ORAL | @ 23:00:00 | NDC 62584068311

## 2013-10-05 MED ADMIN — oxyCODONE IR (ROXICODONE) tablet 10 mg: ORAL | @ 19:00:00 | NDC 68308050547

## 2013-10-05 MED ADMIN — acetaminophen (TYLENOL) tablet 1,000 mg: ORAL | @ 02:00:00 | NDC 50580045110

## 2013-10-05 MED ADMIN — diphenhydrAMINE (BENADRYL) capsule 25 mg: ORAL | @ 06:00:00 | NDC 00904530661

## 2013-10-05 MED ADMIN — oxyCODONE IR (ROXICODONE) tablet 10 mg: ORAL | @ 23:00:00 | NDC 68308050547

## 2013-10-05 MED ADMIN — simethicone (MYLICON) tablet 80 mg: ORAL | @ 02:00:00 | NDC 63739022510

## 2013-10-05 MED FILL — DIPHENHYDRAMINE 25 MG CAP: 25 mg | ORAL | Qty: 1

## 2013-10-05 MED FILL — IBUPROFEN 800 MG TAB: 800 mg | ORAL | Qty: 1

## 2013-10-05 MED FILL — SIMETHICONE 80 MG CHEWABLE TAB: 80 mg | ORAL | Qty: 1

## 2013-10-05 MED FILL — OXYCODONE 5 MG TAB: 5 mg | ORAL | Qty: 2

## 2013-10-05 MED FILL — MAPAP EXTRA STRENGTH 500 MG TABLET: 500 mg | ORAL | Qty: 2

## 2013-10-05 MED FILL — DOCUSATE SODIUM 100 MG CAP: 100 mg | ORAL | Qty: 1

## 2013-10-05 NOTE — Progress Notes (Signed)
Patient discharged to home after ID bands verified and newborn's code alert removed.  Discharge teaching complete, patient verbalizes understanding; questions encouraged. Pt transported with infant in arms via wheelchair to private vehicle. FOB carried second infant in carseat. Infants  placed in car seat correctly.  Stable at discharge.

## 2013-10-05 NOTE — Progress Notes (Signed)
Mom and babies going home today. Mother plans to pump and bottle feed only. Mother has been supplementing both infants with Neosure. Mother states she has been pumping but only getting drops. Mother states infant B is feeding better with the bottle. Baby A continues to do well with bottle per mom. Instructed mother to continue pumping every 3 hours for stimulation and to give all pumped colostrum to babies. Mother is renting hospital pump for discharge. Discussed infants' need for minimum of 8 feedings in 24 hours and to watch output. Discussed jaundice with mother. Infants have follow up at [facility] tomorrow 10/06/13. Feeding plan given with full instruction on pumping and amount of milk each infant needs at each feeding. Reviewed engorgement, normal pumping volumes, and milk storage. Encouraged mother to call as needed for assistance/ concerns. Outpatient lactation handout given.

## 2013-10-05 NOTE — Progress Notes (Signed)
Pt given meds as ordered. Discharge instructions and prescriptions given to pt. Pt voices understanding, questions ask and they were answered to pt. Satisfaction. Mom was given discharge instructions earlier on babies and she states she voiced understanding of those.  Things packed and ready for discharge. Pt refused TDap and Flu vaccines.  Awaiting FOB to return with payment slip for breast pump rental.

## 2013-10-05 NOTE — Progress Notes (Signed)
Report received from Trina Hutchins RN. Plan of care discussed. Care assumed. Pt resting in bed. No needs at present.

## 2013-10-05 NOTE — Progress Notes (Signed)
Mom and baby are going home today.  Continue to offer the breast without restriction.  Mom's milk should be fully in over the next few days.  Reviewed engorgement precautions.  Hand Expression has been demoed and written hand-out reviewed.  As milk comes in baby will be more alert at the breast and swallows will be more obvious.  Breasts may feel softer once baby has finished nursing.  Baby should be back to birth weight by 2 weeks of age.  And then gain on average 1 oz per day for the next 2-3 months.  Reviewed babies should be exclusively breastfeeding for the first 6 months and that breastfeeding should continue after introduction of appropriate complimentary foods after 6 months.  Initial output should be at least 1 wet and 1 bowel movement for each day old baby is.  By day 5-7 once milk is fully in baby will consistently have 6 or more soaking wet diapers and about 4 bowel movement.  Some babies have a bowel movement with every feeding and some have 1-3 large bowel movements each day.  Inadequate output may indicate inadequate feedings and should be reported to your Pediatrician.  Bowel habits may change as baby gets older.  Encouraged follow-up at Pediatrician in 1-2 days, no later than 1 week of age.  Call OP Lactation Center for any questions as needed or to set up an OP visit.  OP phone calls are returned within 24 hours.  Breastfeeding Support Group is offered here monthly.  Community Breastfeeding Resource List given.

## 2013-10-05 NOTE — Progress Notes (Signed)
Report rec'd from R. Bufford ButtnerWorley, Charity fundraiserN. Pt is awaiting discharge orders from MD at this time. Pt has been given discharge instructions for her twins and has voiced understanding and rec'd written instructions for them. Discharge instructions for pt will be given when completed orders from MD has been obtained.

## 2013-10-05 NOTE — Discharge Summary (Signed)
Obstetrical Discharge Summary     Name: Ashley Pearson MRN: [id number]  SSN: [id number]    Date of Birth: 04/03/1986  Age: 28 y.o.  Sex: female      Admit Date: 10/01/2013    Discharge Date: 10/05/2013     Admitting Physician: Boykin Reaperhomas M Roesch, MD     Attending Physician:  Boykin Reaperhomas M Roesch, MD     Admission Diagnoses: LABOR;LABOR;LABOR;Delivery normal    Discharge Diagnoses:   Information for the patient's newborn:  Adella HareEDWARDS, Baby Boy A [[id number]][[id number]]   @1207427 @  Information for the patient's newborn:  Adella HareEDWARDS, Baby Boy B [[id number]][[id number]]   @1207427 @         Additional Diagnoses:   Problem List as of 10/05/2013 Date Reviewed: 10/02/2013        ICD-9-CM Class Noted - Resolved    Delivery normal 650  10/03/2013 - Present        Constipation in pregnancy in third trimester 648.93 564.00  10/02/2013 - Present        *Preterm labor third trimester with preterm delivery third trimester 644.21  10/02/2013 - Present        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  09/12/2013 - Present    Overview    Signed 09/12/2013  9:49 AM by Richardson LandryLaura B Dobbs, NP      History-pt has Valtrex for prophylactic tx at end of pregnancy          Short cervix in third trimester, antepartum 649.73  09/12/2013 - Present    Overview    Addendum 09/12/2013  9:55 AM by Richardson LandryLaura B Dobbs, NP      28w- CX 3.3cm  32w- CX 1.9cm-sent back to MFM          Dichorionic diamniotic twin gestation V91.03  09/12/2013 - Present        Uterine fibroid in pregnancy 654.10  04/02/2013 - Present        RESOLVED: Abdominal pain in pregnancy, antepartum 646.83 789.00  10/02/2013 - 10/02/2013        RESOLVED: Multiple gestation in first trimester 651.93  04/02/2013 - 10/02/2013        RESOLVED: Supervision of normal first pregnancy in first trimester V22.0  03/20/2013 - 09/12/2013             Lab Results   Component Value Date/Time    ABO,Rh A POS 03/16/2013    There is no immunization history for the selected administration types on file for this patient.     Discharge Condition: Good    Hospital Course: Normal  hospital course following the delivery.    Patient Instructions: See DC instructions  Current Discharge Medication List      CONTINUE these medications which have NOT CHANGED    Details   cholecalciferol, vitamin D3, (VITAMIN D3) 2,000 unit tab Take  by mouth.      folic acid 800 mcg tablet Take 800 mcg by mouth daily.      iron-FA#6-C-B12-biot-coppr-DSS (FERIVAFA) 110 mg-1 mg -175 mg-12 mcg cap Take 1 Tab by mouth daily.  Qty: 30 Tab, Refills: 11      PNV no.24-iron-folic acid-dha (PRENATAL DHA+COMPLETE PRENATAL) 30-975-300 mg-mcg-mg cmpk Take  by mouth.      valACYclovir (VALTREX) 500 mg tablet Take 1 Tab by mouth two (2) times a day. See handout by provider for specific dosing  Qty: 30 Tab, Refills: 5             Reference my discharge instructions.    Follow-up  Appointments   Procedures   ??? FOLLOW UP VISIT Appointment in: 6 Weeks     Standing Status: Standing      Number of Occurrences: 1      Standing Expiration Date:      Order Specific Question:  Appointment in     Answer:  6 Weeks        Signed By:  Max Sane, MD     October 05, 2013

## 2013-10-05 NOTE — Progress Notes (Signed)
Individualized Feeding Plan for Breastfeeding BABY B  As much as possible, hold your baby on your chest so baby???s bare skin is against your bare skin with a blanket covering baby???s back, especially 30 minutes before it is time for baby to eat.   Watch for early feeding cues such as, licking lips, sucking motions, rooting, hands to mouth. Crying is a late feeding cue.   Feed your baby at least 8 times in 24 hours, or more if your baby is showing feeding cues. If baby is sleepy put baby skin to skin and watch for hunger cues. To rouse baby: unwrap, undress, massage hands, feet, & back, change diaper, cool washcloth, gently change baby???s position from lying to sitting.   At each feeding:   __x__1. Do ???Suck Practice??? on finger before each feeding until sucking pattern is smooth. Try using index finger.   __x__2. Hand Express for a few minutes prior to latching to help start milk flow.   __x__3. Double pump for 15 minutes with breast massage and compression. Hand express for an additional 2-3 minutes per side. If you are not putting baby to the breast you need to pump 8 times a day. Pump every at least every 3 hours.   __x__4. Give baby all of the breast milk you obtain using a straight syringe, curved syringe, or bottle.   If baby does NOT have enough wet and dirty diapers per day, is jaundiced/lethargic, or has significant weight loss call lactation department /pediatrician if you have concerns. Continue supplementing with formula to reach intake amounts listed until full milk supply.   AVERAGE INTAKES OF COLOSTRUM BY HEALTHY BREASTFED INFANTS:   Time Day Intake (ml/feed)   1st 24 hrs 1 2-10 ml   24-48 hrs 1-2 5-15 ml   48-72 hrs 2-3 15-30 ml (0.5-1 oz)   72-96 hrs 3-4 30-45 ml (1-1.5oz)   4-5 45-7560ml (1.5-2oz)   5-7 60ml (2 oz)   By day 5-7, baby will need 60 ml or 2 oz of total volume each feeding based on 8 feedings per day & baby???s weight. (1oz = 30ml).   Comments: Use feeding plan until follow up with  pediatrician. Pump every 3 hours. Give all pumped colostrum/breastmilk at each feeding.   OUTPATIENT APPOINTMENT AVAILABLE IF NEEDED [phone]

## 2013-10-05 NOTE — Progress Notes (Signed)
Individualized Feeding Plan for Breastfeeding  BABY A  As much as possible, hold your baby on your chest so baby???s bare skin is against your bare skin with a blanket covering baby???s back, especially 30 minutes before it is time for baby to eat.   Watch for early feeding cues such as, licking lips, sucking motions, rooting, hands to mouth. Crying is a late feeding cue.   Feed your baby at least 8 times in 24 hours, or more if your baby is showing feeding cues. If baby is sleepy put baby skin to skin and watch for hunger cues. To rouse baby: unwrap, undress, massage hands, feet, & back, change diaper, cool washcloth, gently change baby???s position from lying to sitting.   At each feeding:   __x__1. Do ???Suck Practice??? on finger before each feeding until sucking pattern is smooth. Try using index finger.   __x__2. Hand Express for a few minutes prior to latching to help start milk flow.   __x__3. Double pump for 15 minutes with breast massage and compression. Hand express for an additional 2-3 minutes per side.If you are not putting baby to the breast you need to pump 8 times a day. Pump every at least every 3 hours.   __x__4. Give baby all of the breast milk you obtain using a straight syringe, curved syringe, or bottle.   If baby does NOT have enough wet and dirty diapers per day, is jaundiced/lethargic, or has significant weight loss call lactation department /pediatrician if you have concerns. Continue supplementing to reach intake amount until full milk supply.   AVERAGE INTAKES OF COLOSTRUM BY HEALTHY BREASTFED INFANTS:   Time Day Intake (ml/feed)   1st 24 hrs 1 2-10 ml   24-48 hrs 1-2 5-15 ml   48-72 hrs 2-3 15-30 ml (0.5-1 oz)   72-96 hrs 3-4 30-45 ml (1-1.5oz)   4-5 45-9160ml (1.5-2oz)   5-7 60ml (2 oz)   By day 5-7, baby will need 60 ml or 2 oz of total volume each feeding based on 8 feedings per day & baby???s weight. (1oz = 30ml).   Comments: Use feeding plan until follow up with pediatrician. Pump every 3 hours.  Give all pumped colostrum/breastmilk at each feeding.   OUTPATIENT APPOINTMENT AVAILABLE IF NEEDED [phone]

## 2013-10-05 NOTE — Progress Notes (Signed)
Pt ambulating with FOB to car to place few personal belongings is car. MGM in room with twins at this time.

## 2013-10-05 NOTE — Progress Notes (Signed)
Post-Operative Day Number 3 Progress/Discharge Note    Patient doing well post-op day 3 from cesarean delivery without significant complaints.  Pain controlled on current medication.  Voiding without difficulty, normal lochia.    Vitals:  Patient Vitals for the past 8 hrs:   BP Temp Pulse Resp   10/05/13 1435 127/61 mmHg 98.2 ??F (36.8 ??C) 93 18     Temp (24hrs), Avg:98.3 ??F (36.8 ??C), Min:98.2 ??F (36.8 ??C), Max:98.3 ??F (36.8 ??C)      Vital signs stable, afebrile.    Exam:  Patient without distress.               Abdomen soft, fundus firm at level of umbilicus, non tender.  Incision dry and intact, clean without erythema.               Lower extremities are significant for bilateral non-pitting edema, no cords or calf tenderness.    Lab/Data Review:  All lab results for the last 24 hours reviewed.    Assessment and Plan:  Patient appears to be having uncomplicated post-cesarean course.  Continue routine post-op care and maternal education.  Plan discharge for today with follow up in our office in 6 weeks. Desires paraguard IUD for postpartum contraception which will be discussed at postpartum appt. Reviewed need to ambulate and elevate LE when resting to help with LE edema which may take 2 weeks to fully resolve.

## 2013-10-05 NOTE — Progress Notes (Signed)
Report from night shift nurse that bands are still wrong on infants. Verified with Westly PamLisa Riddle RN, SCN nurse that was at delivery, that the bigger baby (6lbs 1oz) was born first at 2116 on 10/02/13. Infants were weighed. The infant that weighed the most (5lbs 15oz) is currently being called baby "B". Explained to mom that largest infant was born first and baby "B" is actually baby "A".     Second infant weighed (5lbs 7oz) was born second and is currently labeled as "A". Explained to mom the smaller baby born second and is actually baby "B".     Verified identity with identification record using feet prints and weight.  Each baby and mother and grandmother were rebanded. (Dad is not here and will not be here today with other old bands)    Correct infant "A" has band number [id number] was born 10/02/13 @ 2116 weighing 6lbs 1oz. DC weight 5lb 15oz.  Correct infant "B" has band number [id number] FBT was born 10/[birth year]/13 @ 2118 weighing 5lb11oz. DC weight 5lb7oz.

## 2013-10-05 NOTE — Progress Notes (Signed)
Referral per MD.  Mom with questionable bonding.  SW spoke with RN Agricultural engineer( Crystal) who states pt has been approp with twins today.  SW spoke with pt, MGM  Belva Bertin( Tracey Hunt [phone] ) at bedside but did not participate in conversation.  Pt states she is originally from [place] but went to [place] in [place], [place] got a job there so stayed.  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  FOB is involved and helping provide for babies.  Pt states since babies were born early she is unsure when she will need to return to work but both her mom and g'mother will be taking turns coming to [place] to help her care for babies when she returns to work.  Pt hopes at the end of the school year to find a job in [place] and be able to return to the area. Pt states has all NB supplies.  [facility] f[facility] will [facility], appt on Friday.

## 2013-10-06 LAB — CULTURE, URINE
Culture result:: 20000
Culture result:: >100000
Culture: 20000
Culture: >100000

## 2013-10-06 MED ADMIN — simethicone (MYLICON) tablet 80 mg: ORAL | @ 02:00:00 | NDC 63739022510

## 2013-10-06 MED ADMIN — ibuprofen (MOTRIN) tablet 800 mg: ORAL | @ 02:00:00 | NDC 68084077211

## 2013-10-06 MED FILL — IBUPROFEN 800 MG TAB: 800 mg | ORAL | Qty: 1

## 2013-10-06 MED FILL — SIMETHICONE 80 MG CHEWABLE TAB: 80 mg | ORAL | Qty: 1

## 2013-10-09 NOTE — Telephone Encounter (Signed)
Follow up phone call:  Mom pumping and bottle feeding now for twins.  Has some milk.  Mom is happy with just pumping and bottle feeding at this time.  No problems or questions.  Encouraged to call if assistance at the breast was desired, or for any other lactation needs.  Pumping about 1-1.5 oz every 4 hours.

## 2013-11-13 NOTE — Patient Instructions (Addendum)
MyChart Activation    Thank you for requesting access to MyChart. Please follow the instructions below to securely access and download your online medical record. MyChart allows you to send messages to your doctor, view your test results, renew your prescriptions, schedule appointments, and more.    How Do I Sign Up?    1. In your internet browser, go to [email or web address]  2. Click on the First Time User? Click Here link in the Sign In box. You will be redirect to the New Member Sign Up page.  3. Enter your MyChart Access Code exactly as it appears below. You will not need to use this code after you???ve completed the sign-up process. If you do not sign up before the expiration date, you must request a new code.    MyChart Access Code: [email or web address]  Expires: 02/11/2014  3:58 PM (This is the date your MyChart access code will expire)    4. Enter the last four digits of your Social Security Number (xxxx) and Date of Birth (mm/dd/yyyy) as indicated and click Submit. You will be taken to the next sign-up page.  5. Create a MyChart ID. This will be your MyChart login ID and cannot be changed, so think of one that is secure and easy to remember.  6. Create a MyChart password. You can change your password at any time.  7. Enter your Password Reset Question and Answer. This can be used at a later time if you forget your password.   8. Enter your e-mail address. You will receive e-mail notification when new information is available in MyChart.  9. Click Sign Up. You can now view and download portions of your medical record.  10. Click the Download Summary menu link to download a portable copy of your medical information.    Additional Information    If you have questions, please visit the Frequently Asked Questions section of the MyChart website at [email or web address] Remember, MyChart is NOT to be used for urgent needs. For medical emergencies, dial 911.      Stress in Parents of Infants: After  Your Visit  Your Care Instructions  Meeting the increased demands of being a new parent can be a big challenge. It is easy to get overtired and overwhelmed during the first weeks. What used to be a simple chore, such as buying groceries, is not so simple now. Plus, you have new chores, including feeding and changing your new baby. At the end of the day, you may be so tired that you feel like crying. Instead of looking forward to the next day, you may be dreading tomorrow. Like many new parents, you are burned out from the stress of having a new baby.  Stress affects each of us differently, and the most effective ways to relieve it are different for each person. You can try different methods to find out which ones work best for you. As the weeks go by, you will begin to develop a rhythm with your baby. Tasks that now seem to take forever will become easier.  Many women get the "baby blues" during the first few days after childbirth. If you are a new mother and the "baby blues" last more than a few days, call your doctor right away. Depression is a medical condition that requires treatment.  Follow-up care is a key part of your treatment and safety. Be sure to make and go to all appointments, and call your doctor if you are having problems. It???s also a  good idea to know your test results and keep a list of the medicines you take.  How can you care for yourself at home?  ?? Be kind to yourself. Your new baby takes a lot of work, but he or she can give you a lot of pleasure too. Do not worry about housekeeping for a while.  ?? Allow your friends to bring you meals or do chores.  ?? Limit visitors to as few as you feel you can handle, or ask them not to visit for a while. Before they come, set a limit on how long they will stay.  ?? Sleep when your baby sleeps. Even a short nap helps.  ?? Find what triggers your stress, and avoid those things as much as you can.  ?? If you breast-feed, learn how to collect and store some  breast milk so your partner or babysitter can feed the baby while you sleep.  ?? Eat a balanced diet so you can keep up your energy.  ?? Drink plenty of fluids throughout the day.  ?? Avoid caffeine and alcohol. Caffeine is found in coffee, tea, cola drinks, chocolate, and other foods.  ?? Limit medicines that can make you more tired, such as tranquilizers and cold and allergy medicines.  ?? Get regular daily exercise, such as walks, to help improve your mood. Rest after you exercise.  ?? Be honest with yourself and those who care about you. Tell them you are stressed and tired.  ?? Talking to other new parents can help. Your local hospital may have support groups for new parents. Hearing that someone else is having the same experiences you are can help a lot.  ?? If you have the baby blues for more than a few days, call your doctor right away.  When should you call for help?  Call 911 anytime you think you may need emergency care. For example, call if:  ?? You have thoughts of hurting yourself, your baby, or another person.  Call your doctor now or seek immediate medical care if:  ?? You are having trouble caring for yourself or your baby.  Watch closely for changes in your health, and be sure to contact your doctor if you have any problems.   Where can you learn more?   Go to [email or web address]  Enter H142 in the search box to learn more about "Stress in Parents of Infants: After Your Visit."   ?? 2006-2014 [employer]. Care instructions adapted under license by [employer] (which disclaims liability or warranty for this information). This care instruction is for use with your licensed healthcare professional. If you have questions about a medical condition or this instruction, always ask your healthcare professional. Healthwise, Incorporated disclaims any warranty or liability for your use of this information.  Content Version: 10.2.346038; Current as of: July 29, 2012

## 2013-11-13 NOTE — Progress Notes (Signed)
HISTORY OF PRESENT ILLNESS  Levin Baconorsha Kandasia Ourada is a 28 y.o. female.  HPI  Subjective: This 28 y.o. female presents today for a post-partum visit after c-section.  She c/o lingering tenderness around incision site and she is currently having her first period postpartum that she reports is heavier than usual. She is interested in getting a Mirena placed.     Delivery Method: Cesarean.  Status of Baby: Disposition of babies: home care. Twin Babies are Formula fed.    Postpartum Contraception: none-desires Mirena      Past Medical History   Diagnosis Date   ??? Headache    ??? UTI (urinary tract infection)    ??? Abnormal Pap smear      colposcopy, then normal   ??? Constipation, chronic    ??? 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 Abdominal pain in pregnancy, antepartum 10/02/2013   ??? Constipation in pregnancy in third trimester 10/02/2013   ??? Preterm labor third trimester with preterm delivery third trimester 10/02/2013     Past Surgical History   Procedure Laterality Date   ??? Hx tonsil and adenoidectomy  1997   ??? Hx wisdom teeth extraction  2007   ??? Hx colonoscopy       high school     History     Social History   ??? Marital Status: SINGLE     Spouse Name: N/A     Number of Children: N/A   ??? Years of Education: N/A     Occupational History   ??? Not on file.     Social History Main Topics   ??? Smoking status: Never Smoker    ??? Smokeless tobacco: Never Used   ??? Alcohol Use: No   ??? Drug Use: No   ??? Sexual Activity:     Partners: Male     Pharmacist, hospitalBirth Control/ Protection: Condom     Other Topics Concern   ??? Not on file     Social History Narrative         BP 118/78    Pulse 79    Ht 5\' 8"  (1.727 m)    Wt 178 lb (80.74 kg)    BMI 27.07 kg/m2      SpO2 99%    LMP 01/31/2013        Review of Systems   Constitutional:        Incisional tenderness   HENT: Negative.    Eyes: Negative.    Respiratory: Negative.    Cardiovascular:  Negative.    Gastrointestinal: Negative.    Genitourinary:        Heavy period   Musculoskeletal: Negative.    Skin: Negative.    Neurological: Negative.    Endo/Heme/Allergies: Negative.    Psychiatric/Behavioral: Negative.        Physical Exam   Constitutional: She is oriented to person, place, and time. She appears well-developed and well-nourished. She is active and cooperative.  Non-toxic appearance. She does not have a sickly appearance. She does not appear ill. No distress.   HENT:   Head: Normocephalic and atraumatic.   Eyes: Right eye exhibits no discharge. Left eye exhibits no discharge. No scleral icterus.   Neck: Normal range of motion.   Cardiovascular: Normal rate and normal heart sounds.    Pulmonary/Chest: Effort normal and breath sounds  normal. No respiratory distress.   Abdominal: Soft. Normal appearance. There is tenderness in the suprapubic area. There is no rigidity and no guarding.       Musculoskeletal: Normal range of motion.   Neurological: She is alert and oriented to person, place, and time. Coordination and gait normal.   Skin: Skin is warm and dry. She is not diaphoretic.   Psychiatric: She has a normal mood and affect. Her speech is normal and behavior is normal. Judgment and thought content normal. Cognition and memory are normal.   Nursing note and vitals reviewed.      ASSESSMENT and PLAN  Patient is doing postpartum from a primary cesarean section for twins/breech Baby A. She is well healed from her LTCS. Her bleeding had ceased but has now returned as a menstrual cycle. She is voiding without difficulty or involuntarily. She is doing well emotionally and does not feel depressed. She had 2 baby boys whom she is bottle feeding. Patient is released from restricted activity and pelvic rest. Contraceptive counseling provided. Patient has decided to use Mirena. Advised on barrier contraception until Mirena placed. Office will call to schedule once insurance is verified and cost to pt is  communicated. Follow up for routine GYN annual exam in 5 months.     Encounter Diagnoses   Name Primary?   ??? Status post cesarean section routine postpartum follow-up Yes     No orders of the defined types were placed in this encounter.     Follow-up Disposition:  Return in about 5 months (around 04/15/2014), or if symptoms worsen or fail to improve, for annual exam.

## 2013-11-14 NOTE — Telephone Encounter (Signed)
Left message for pt to call me re: Mirena. She would just have an $81 copay for this.  Will call her when the devices come in to schedule an appt for insertion

## 2013-12-05 NOTE — Telephone Encounter (Signed)
Left message for pt to call to schedule appt for Mirena. Stressed that I only had a limited supply and to call as soon as she got a chance.

## 2013-12-20 LAB — AMB POC URINE PREGNANCY TEST, VISUAL COLOR COMPARISON: HCG urine, Ql. (POC): NEGATIVE

## 2013-12-20 NOTE — Patient Instructions (Addendum)
MyChart Activation    Thank you for requesting access to MyChart. Please follow the instructions below to securely access and download your online medical record. MyChart allows you to send messages to your doctor, view your test results, renew your prescriptions, schedule appointments, and more.    How Do I Sign Up?    1. In your internet browser, go to [email or web address]  2. Click on the First Time User? Click Here link in the Sign In box. You will be redirect to the New Member Sign Up page.  3. Enter your MyChart Access Code exactly as it appears below. You will not need to use this code after you???ve completed the sign-up process. If you do not sign up before the expiration date, you must request a new code.    MyChart Access Code: [email or web address]  Expires: 02/11/2014  4:58 PM (This is the date your MyChart access code will expire)    4. Enter the last four digits of your Social Security Number (xxxx) and Date of Birth (mm/dd/yyyy) as indicated and click Submit. You will be taken to the next sign-up page.  5. Create a MyChart ID. This will be your MyChart login ID and cannot be changed, so think of one that is secure and easy to remember.  6. Create a MyChart password. You can change your password at any time.  7. Enter your Password Reset Question and Answer. This can be used at a later time if you forget your password.   8. Enter your e-mail address. You will receive e-mail notification when new information is available in MyChart.  9. Click Sign Up. You can now view and download portions of your medical record.  10. Click the Download Summary menu link to download a portable copy of your medical information.    Additional Information    If you have questions, please visit the Frequently Asked Questions section of the MyChart website at [email or web address] Remember, MyChart is NOT to be used for urgent needs. For medical emergencies, dial 911.      Intrauterine Device (IUD) Insertion:  After Your Visit  Your Care Instructions     The intrauterine device (IUD) is a very effective method of birth control. It is a small, plastic, T-shaped device that contains copper or hormones. The doctor inserts the IUD into your uterus. A plastic string tied to the end of the IUD hangs down through the cervix into the vagina.  There are two types of IUDs. The copper IUD is effective for up to 10 years. The hormonal IUD is effective for either 3 years or 5 years, depending on which IUD is used. The hormonal IUD also reduces menstrual bleeding and cramping. Both types of IUD damage or kill the man's sperm. This means that the woman's egg does not join with the sperm. IUDs also change the lining of the uterus so that the egg does not lodge there.  The IUD is most likely to work well for women who have been pregnant before. Some women who have never been pregnant have more trouble keeping the IUD in the uterus. They also may have more pain and cramping after insertion.  Follow-up care is a key part of your treatment and safety. Be sure to make and go to all appointments, and call your doctor if you are having problems. It's also a good idea to know your test results and keep a list of the medicines you take.  How can you care for yourself at home?  ??  You may experience some mild cramping and light bleeding (spotting) for 1 or 2 days. Use a hot water bottle or a heating pad set on low on your belly for pain.  ?? Take an over-the-counter pain medicine, such as acetaminophen (Tylenol), ibuprofen (Advil, Motrin), and naproxen (Aleve) if needed. Read and follow all instructions on the label.  ?? Do not take two or more pain medicines at the same time unless the doctor told you to. Many pain medicines have acetaminophen, which is Tylenol. Too much acetaminophen (Tylenol) can be harmful.  ?? Check the string of your IUD after every period. To do this, insert a finger into your vagina and feel for the cervix, which is at the top  of the vagina and feels harder than the rest of your vagina. You should be able to feel the thin, plastic string coming out of the opening of your cervix. If you cannot feel the string, use another form of birth control and make an appointment with your doctor to have the string checked.  ?? If the IUD comes out, save it and call your doctor. Be sure to use another form of birth control while the IUD is out.  ?? Use latex condoms to protect against sexually transmitted infections (STIs), such as gonorrhea and chlamydia. An IUD does not protect you from STIs. Having one sex partner (who does not have STIs and does not have sex with anyone else) is a good way to avoid STIs.  When should you call for help?  Call 911 anytime you think you may need emergency care. For example, call if:  ?? You passed out (lost consciousness).  ?? You have sudden, severe pain in your belly or pelvis.  Call your doctor now or seek immediate medical care if:  ?? You have new belly or pelvic pain.  ?? You have severe vaginal bleeding. You are passing clots of blood and soaking through your usual pads or tampons every hour for 2 or more hours.  ?? You are dizzy or lightheaded, or you feel like you may faint.  ?? You have a fever and pelvic pain or vaginal discharge.  ?? You have pelvic pain that is getting worse.  Watch closely for changes in your health, and be sure to contact your doctor if:  ?? You cannot feel the string, or the IUD comes out.  ?? You feel sick to your stomach, or you vomit.  ?? You think you may be pregnant.   Where can you learn more?   Go to [email or web address]  Enter [phone] in the search box to learn more about "Intrauterine Device (IUD) Insertion: After Your Visit."   ?? 2006-2015 Healthwise, Incorporated. Care instructions adapted under license by [employer] (which disclaims liability or warranty for this information). This care instruction is for use with your licensed healthcare professional. If you have  questions about a medical condition or this instruction, always ask your healthcare professional. Healthwise, Incorporated disclaims any warranty or liability for your use of this information.  Content Version: 10.4.390249; Current as of: July 28, 2013

## 2013-12-20 NOTE — Progress Notes (Signed)
Patient had Mirena IUD insertion performed by [facility] on 12/20/13.

## 2014-02-22 ENCOUNTER — Ambulatory Visit (INDEPENDENT_AMBULATORY_CARE_PROVIDER_SITE_OTHER): Payer: BC Managed Care – PPO | Admitting: Family Medicine

## 2014-02-22 VITALS — BP 110/80 | HR 73 | Temp 98.5°F | Resp 18 | Ht 65.75 in | Wt 183.0 lb

## 2014-02-22 DIAGNOSIS — Z111 Encounter for screening for respiratory tuberculosis: Secondary | ICD-10-CM

## 2014-02-22 NOTE — Progress Notes (Signed)
Subjective:  This chart was scribed for Michelle ChickKristi M Jachelle Fluty, MD by Charline BillsEssence Howell, ED Scribe. The patient was seen in room 4. Patient's care was started at 5:27 PM.   Patient ID: Michelle Huber, female    DOB: 11/25/1985, 28 y.o.   MRN: [id number]  02/22/2014  PPD Placement  HPI HPI Comments: Michelle Huber is a 28 y.o. female who presents to the [facility] for a PPD placement. 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  for [place] teaching position. She denies recent sickness, positive TB test or living with someone with tuberculosis. She also denies any pertinent medical conditions. She has 735 month old twin boys via c-section. Pt is not breast feeding. She denies tobacco or alcohol use. No known allergies. No current medications.    Review of Systems  Constitutional: Negative for fever, chills, diaphoresis, fatigue and unexpected weight change.  Respiratory: Negative for cough and shortness of breath.     History reviewed. No pertinent past medical history. No Known Allergies No current outpatient prescriptions on file.   No current facility-administered medications for this visit.   History   Social History  . Marital Status: Single    Spouse Name: N/A    Number of Children: N/A  . Years of Education: N/A   Occupational History  . Not on file.   Social History Main Topics  . Smoking status: Never Smoker   . Smokeless tobacco: Not on file  . Alcohol Use: No  . Drug Use: No  . Sexual Activity: Not on file   Other Topics Concern  . 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 Constitutional: She is oriented to person, place, and time. She appears well-developed and well-nourished. No distress.  HENT:  Head: Normocephalic and atraumatic.  Eyes: Conjunctivae and EOM are normal.  Neck: Normal range of motion.  Cardiovascular: Normal rate and regular rhythm.   Pulmonary/Chest: Effort normal and breath sounds normal.  Musculoskeletal: Normal range of motion.  Neurological: She is alert and oriented to person, place, and time.  Skin: Skin is warm and dry. She is not diaphoretic.  Psychiatric: She has a normal mood and affect. Her behavior is normal.   No results found for this or any previous visit.     Assessment & Plan:   1. PPD screening test    1. Tb screening: Tb skin test placed; asymptomatic; RTC 48-72 hours for read.  Letter also provided that asymptomatic and per current guidelines, no evidence of active Tb.  No orders of the defined types were placed in this encounter.    No Follow-up on file.   I personally performed the services described in this documentation, which was scribed in my presence. The recorded information has been reviewed and  is accurate.   Nilda Simmer, M.D.  [facility] [address] [place], [place]  [address] [phone] phone [phone] fax

## 2014-02-22 NOTE — Patient Instructions (Signed)
1.  Please return in 48-72 hours for Tb skin read.  You must return after Saturday, 6/13 5:30pm or before Sunday, 6/14 at 5:30pm.

## 2014-02-22 NOTE — Progress Notes (Signed)

## 2014-02-24 ENCOUNTER — Ambulatory Visit (INDEPENDENT_AMBULATORY_CARE_PROVIDER_SITE_OTHER): Payer: BC Managed Care – PPO | Admitting: *Deleted

## 2014-02-24 DIAGNOSIS — Z09 Encounter for follow-up examination after completed treatment for conditions other than malignant neoplasm: Secondary | ICD-10-CM

## 2014-02-24 DIAGNOSIS — Z111 Encounter for screening for respiratory tuberculosis: Secondary | ICD-10-CM

## 2014-02-24 LAB — TB SKIN TEST
Induration: 0 mm
TB Skin Test: NEGATIVE

## 2014-02-24 NOTE — Progress Notes (Deleted)
   Subjective:    Patient ID: Michelle Huber, female    DOB: 08/27/1986, 28 y.o.   MRN: [id number]  HPI    Review of Systems     Objective:   Physical Exam        Assessment & Plan:

## 2014-02-24 NOTE — Progress Notes (Signed)
   Subjective:    Patient ID: Michelle Huber, female    DOB: 04-10-1986, 28 y.o.   MRN: [id number]  HPI Here for PPD reading. TB skin test placed on 02/22/14 at 5:30 pm, within 48-72 hr window.    Review of Systems     Objective:   Physical Exam        Assessment & Plan:  Tb skin test- negative, 0mm induration.

## 2014-02-24 NOTE — Progress Notes (Deleted)
The patient {can/cannot:17900} distinguish the colors red, amber and green.

## 2014-03-06 NOTE — Patient Instructions (Signed)
MyChart Activation    Thank you for requesting access to MyChart. Please follow the instructions below to securely access and download your online medical record. MyChart allows you to send messages to your doctor, view your test results, renew your prescriptions, schedule appointments, and more.    How Do I Sign Up?    1. In your internet browser, go to [email or web address]  2. Click on the First Time User? Click Here link in the Sign In box. You will be redirect to the New Member Sign Up page.  3. Enter your MyChart Access Code exactly as it appears below. You will not need to use this code after you???ve completed the sign-up process. If you do not sign up before the expiration date, you must request a new code.    MyChart Access Code: [email or web address]  Expires: 06/04/2014  3:07 PM (This is the date your MyChart access code will expire)    4. Enter the last four digits of your Social Security Number (xxxx) and Date of Birth (mm/dd/yyyy) as indicated and click Submit. You will be taken to the next sign-up page.  5. Create a MyChart ID. This will be your MyChart login ID and cannot be changed, so think of one that is secure and easy to remember.  6. Create a MyChart password. You can change your password at any time.  7. Enter your Password Reset Question and Answer. This can be used at a later time if you forget your password.   8. Enter your e-mail address. You will receive e-mail notification when new information is available in MyChart.  9. Click Sign Up. You can now view and download portions of your medical record.  10. Click the Download Summary menu link to download a portable copy of your medical information.    Additional Information    If you have questions, please visit the Frequently Asked Questions section of the MyChart website at [email or web address] Remember, MyChart is NOT to be used for urgent needs. For medical emergencies, dial 911.

## 2014-03-06 NOTE — Progress Notes (Signed)
HISTORY OF PRESENT ILLNESS  Ashley Pearson is a 28 y.o. female.  HPI  Patient presents today for   Chief Complaint   Patient presents with   ??? Checkup IUD         LMP: Patient's last menstrual period was 02/13/2014.  Contraception: IUD        No Known Allergies  Current Outpatient Prescriptions   Medication Sig Dispense Refill   ??? valACYclovir (VALTREX) 500 mg tablet Take 1 Tab by mouth two (2) times a day. See handout by provider for specific dosing 30 Tab 5     Past Medical History   Diagnosis Date   ??? Headache    ??? UTI (urinary tract infection)    ??? Abnormal Pap smear      colposcopy, then normal   ??? Constipation, chronic    ??? Secondary seizure disorder (HCC)      febrile seizures as a child   ??? 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 Abdominal pain in pregnancy, antepartum 10/02/2013   ??? Constipation in pregnancy in third trimester 10/02/2013   ??? Preterm labor third trimester with preterm delivery third trimester 10/02/2013     Past Surgical History   Procedure Laterality Date   ??? Hx tonsil and adenoidectomy  1997   ??? Hx wisdom teeth extraction  2007   ??? Hx colonoscopy       high school     History     Social History   ??? Marital Status: SINGLE     Spouse Name: N/A     Number of Children: N/A   ??? Years of Education: N/A     Occupational History   ??? Not on file.     Social History Main Topics   ??? Smoking status: Never Smoker    ??? Smokeless tobacco: Never Used   ??? Alcohol Use: No   ??? Drug Use: No   ??? Sexual Activity:     Partners: Male     Pharmacist, hospitalBirth Control/ Protection: Condom     Other Topics Concern   ??? Not on file     Social History Narrative     Family History   Problem Relation Age of Onset   ??? Breast Cancer Mother    ??? Heart Disease Mother    ??? Migraines Mother    ??? Hypertension Mother    ??? Diabetes Father    ??? Asthma Brother    ??? Ovarian Cancer Paternal Grandmother      BP 115/80 mmHg   Pulse 81   Ht 5\' 8"  (1.727 m)   Wt 178 lb (80.74 kg)    BMI 27.07 kg/m2   SpO2 98%   LMP 02/13/2014             Review of Systems   Constitutional: Negative.    HENT: Negative.    Eyes: Negative.    Respiratory: Negative.    Cardiovascular: Negative.    Gastrointestinal: Negative.    Genitourinary:        Irregular bleeding   Musculoskeletal: Negative.    Skin: Negative.    Neurological: Negative.    Endo/Heme/Allergies: Negative.    Psychiatric/Behavioral: Negative.        Physical Exam   Constitutional: She is oriented to person, place, and time. Vital signs are normal. She appears well-developed and well-nourished. She is active and cooperative. She is easily aroused.  Non-toxic  appearance. She does not have a sickly appearance. She does not appear ill. No distress.   HENT:   Head: Normocephalic and atraumatic.   Nose: Nose normal.   Eyes: EOM are normal. Pupils are equal, round, and reactive to light.   Neck: Normal range of motion. Neck supple.   Cardiovascular: Normal rate.    Pulmonary/Chest: Effort normal. No respiratory distress.   Abdominal: Soft. Bowel sounds are normal. She exhibits no distension. There is no hepatosplenomegaly. There is no tenderness. There is no rebound, no guarding and no CVA tenderness.   Genitourinary: There is no rash, tenderness, lesion or injury on the right labia. There is no rash, tenderness, lesion or injury on the left labia. No erythema, tenderness or bleeding in the vagina. No signs of injury around the vagina. No vaginal discharge found.   IUD strings were noted to be in proper position extruding from external cervical os.   Musculoskeletal: Normal range of motion.   Neurological: She is alert, oriented to person, place, and time and easily aroused. She exhibits normal muscle tone. Coordination normal.   Skin: Skin is warm, dry and intact. No rash noted. She is not diaphoretic. No erythema. No pallor.   Psychiatric: She has a normal mood and affect. Her speech is normal and  behavior is normal. Judgment and thought content normal.   Vitals reviewed.      ASSESSMENT and PLAN  Encounter Diagnoses   Name Primary?   ??? IUD check up Yes     Doing well with IUD in place. Discussed that irregular bleeding is expected and should lessen over time.    No orders of the defined types were placed in this encounter.     Follow-up Disposition:  Return in about 6 months (around 09/05/2014), or if symptoms worsen or fail to improve, for Annual Exam.

## 2015-12-16 ENCOUNTER — Encounter: Attending: Obstetrics & Gynecology
